# Patient Record
Sex: Female | Born: 1960 | Race: White | Hispanic: No | Marital: Married | Smoking: Never smoker
Health system: Southern US, Community
[De-identification: ages and names within clinical notes are randomized; demographics above are authoritative.]

## PROBLEM LIST (undated history)

## (undated) DIAGNOSIS — R51 Headache: Secondary | ICD-10-CM

## (undated) DIAGNOSIS — N926 Irregular menstruation, unspecified: Secondary | ICD-10-CM

## (undated) DIAGNOSIS — F988 Other specified behavioral and emotional disorders with onset usually occurring in childhood and adolescence: Secondary | ICD-10-CM

## (undated) DIAGNOSIS — R0600 Dyspnea, unspecified: Secondary | ICD-10-CM

## (undated) DIAGNOSIS — R519 Headache, unspecified: Secondary | ICD-10-CM

## (undated) DIAGNOSIS — D649 Anemia, unspecified: Secondary | ICD-10-CM

## (undated) HISTORY — PX: WISDOM TOOTH EXTRACTION: SHX21

## (undated) HISTORY — DX: Irregular menstruation, unspecified: N92.6

## (undated) HISTORY — DX: Other specified behavioral and emotional disorders with onset usually occurring in childhood and adolescence: F98.8

---

## 1997-11-21 ENCOUNTER — Inpatient Hospital Stay (HOSPITAL_COMMUNITY): Admission: AD | Admit: 1997-11-21 | Discharge: 1997-11-23 | Payer: Self-pay | Admitting: Gynecology

## 1998-01-12 ENCOUNTER — Other Ambulatory Visit: Admission: RE | Admit: 1998-01-12 | Discharge: 1998-01-12 | Payer: Self-pay | Admitting: Gynecology

## 1999-10-05 ENCOUNTER — Other Ambulatory Visit: Admission: RE | Admit: 1999-10-05 | Discharge: 1999-10-05 | Payer: Self-pay | Admitting: Gynecology

## 2000-11-19 ENCOUNTER — Other Ambulatory Visit: Admission: RE | Admit: 2000-11-19 | Discharge: 2000-11-19 | Payer: Self-pay | Admitting: Gynecology

## 2002-07-17 ENCOUNTER — Ambulatory Visit (HOSPITAL_COMMUNITY): Admission: RE | Admit: 2002-07-17 | Discharge: 2002-07-17 | Payer: Self-pay | Admitting: Internal Medicine

## 2003-03-04 ENCOUNTER — Encounter: Payer: Self-pay | Admitting: Obstetrics & Gynecology

## 2003-03-04 ENCOUNTER — Ambulatory Visit (HOSPITAL_COMMUNITY): Admission: RE | Admit: 2003-03-04 | Discharge: 2003-03-04 | Payer: Self-pay | Admitting: Obstetrics & Gynecology

## 2004-02-18 ENCOUNTER — Ambulatory Visit (HOSPITAL_COMMUNITY): Admission: RE | Admit: 2004-02-18 | Discharge: 2004-02-18 | Payer: Self-pay | Admitting: Interventional Radiology

## 2004-06-10 ENCOUNTER — Ambulatory Visit (HOSPITAL_COMMUNITY): Admission: RE | Admit: 2004-06-10 | Discharge: 2004-06-10 | Payer: Self-pay | Admitting: Obstetrics & Gynecology

## 2005-07-05 ENCOUNTER — Ambulatory Visit (HOSPITAL_COMMUNITY): Admission: RE | Admit: 2005-07-05 | Discharge: 2005-07-05 | Payer: Self-pay | Admitting: Obstetrics & Gynecology

## 2006-07-09 ENCOUNTER — Ambulatory Visit (HOSPITAL_COMMUNITY): Admission: RE | Admit: 2006-07-09 | Discharge: 2006-07-09 | Payer: Self-pay | Admitting: Obstetrics and Gynecology

## 2007-07-12 ENCOUNTER — Ambulatory Visit (HOSPITAL_COMMUNITY): Admission: RE | Admit: 2007-07-12 | Discharge: 2007-07-12 | Payer: Self-pay | Admitting: Obstetrics and Gynecology

## 2007-09-30 ENCOUNTER — Other Ambulatory Visit: Admission: RE | Admit: 2007-09-30 | Discharge: 2007-09-30 | Payer: Self-pay | Admitting: Obstetrics and Gynecology

## 2008-07-22 ENCOUNTER — Ambulatory Visit (HOSPITAL_COMMUNITY): Admission: RE | Admit: 2008-07-22 | Discharge: 2008-07-22 | Payer: Self-pay | Admitting: Obstetrics & Gynecology

## 2009-03-22 ENCOUNTER — Other Ambulatory Visit: Admission: RE | Admit: 2009-03-22 | Discharge: 2009-03-22 | Payer: Self-pay | Admitting: Obstetrics and Gynecology

## 2009-08-04 ENCOUNTER — Ambulatory Visit (HOSPITAL_COMMUNITY): Admission: RE | Admit: 2009-08-04 | Discharge: 2009-08-04 | Payer: Self-pay | Admitting: Obstetrics & Gynecology

## 2010-04-21 ENCOUNTER — Other Ambulatory Visit: Admission: RE | Admit: 2010-04-21 | Discharge: 2010-04-21 | Payer: Self-pay | Admitting: Obstetrics and Gynecology

## 2010-08-16 ENCOUNTER — Ambulatory Visit (HOSPITAL_COMMUNITY)
Admission: RE | Admit: 2010-08-16 | Discharge: 2010-08-16 | Payer: Self-pay | Source: Home / Self Care | Attending: Obstetrics & Gynecology | Admitting: Obstetrics & Gynecology

## 2010-08-25 ENCOUNTER — Encounter
Admission: RE | Admit: 2010-08-25 | Discharge: 2010-08-25 | Payer: Self-pay | Source: Home / Self Care | Attending: Obstetrics & Gynecology | Admitting: Obstetrics & Gynecology

## 2011-05-01 ENCOUNTER — Other Ambulatory Visit: Payer: Self-pay | Admitting: Adult Health

## 2011-05-01 ENCOUNTER — Other Ambulatory Visit (HOSPITAL_COMMUNITY)
Admission: RE | Admit: 2011-05-01 | Discharge: 2011-05-01 | Disposition: A | Discharge status: Home / Self Care | Payer: BC Managed Care – PPO | Source: Ambulatory Visit | Attending: Obstetrics and Gynecology | Admitting: Obstetrics and Gynecology

## 2011-05-01 DIAGNOSIS — Z01419 Encounter for gynecological examination (general) (routine) without abnormal findings: Secondary | ICD-10-CM | POA: Insufficient documentation

## 2011-08-10 ENCOUNTER — Other Ambulatory Visit: Payer: Self-pay | Admitting: Adult Health

## 2011-08-10 DIAGNOSIS — Z139 Encounter for screening, unspecified: Secondary | ICD-10-CM

## 2011-08-21 ENCOUNTER — Ambulatory Visit (HOSPITAL_COMMUNITY)
Admission: RE | Admit: 2011-08-21 | Discharge: 2011-08-21 | Disposition: A | Discharge status: Home / Self Care | Payer: BC Managed Care – PPO | Source: Ambulatory Visit | Attending: Adult Health | Admitting: Adult Health

## 2011-08-21 DIAGNOSIS — Z1231 Encounter for screening mammogram for malignant neoplasm of breast: Secondary | ICD-10-CM | POA: Insufficient documentation

## 2011-08-21 DIAGNOSIS — Z139 Encounter for screening, unspecified: Secondary | ICD-10-CM

## 2012-06-04 ENCOUNTER — Other Ambulatory Visit (HOSPITAL_COMMUNITY)
Admission: RE | Admit: 2012-06-04 | Discharge: 2012-06-04 | Disposition: A | Discharge status: Home / Self Care | Payer: BC Managed Care – PPO | Source: Ambulatory Visit | Attending: Obstetrics and Gynecology | Admitting: Obstetrics and Gynecology

## 2012-06-04 ENCOUNTER — Other Ambulatory Visit: Payer: Self-pay | Admitting: Adult Health

## 2012-06-04 DIAGNOSIS — Z01419 Encounter for gynecological examination (general) (routine) without abnormal findings: Secondary | ICD-10-CM | POA: Insufficient documentation

## 2012-06-04 DIAGNOSIS — Z1151 Encounter for screening for human papillomavirus (HPV): Secondary | ICD-10-CM | POA: Insufficient documentation

## 2012-09-04 ENCOUNTER — Other Ambulatory Visit: Payer: Self-pay | Admitting: Adult Health

## 2012-09-04 DIAGNOSIS — Z139 Encounter for screening, unspecified: Secondary | ICD-10-CM

## 2012-09-09 ENCOUNTER — Ambulatory Visit (HOSPITAL_COMMUNITY)
Admission: RE | Admit: 2012-09-09 | Discharge: 2012-09-09 | Disposition: A | Discharge status: Home / Self Care | Payer: BC Managed Care – PPO | Source: Ambulatory Visit | Attending: Adult Health | Admitting: Adult Health

## 2012-09-09 DIAGNOSIS — Z139 Encounter for screening, unspecified: Secondary | ICD-10-CM

## 2012-09-09 DIAGNOSIS — Z1231 Encounter for screening mammogram for malignant neoplasm of breast: Secondary | ICD-10-CM | POA: Insufficient documentation

## 2012-09-15 IMAGING — MG MM DIGITAL SCREENING
4 series · 4 of 4 positions shown · non-contrast
Comparison: none

DG SCREEN MAMMOGRAM BILATERAL
Bilateral CC and MLO view(s) were taken.

DIGITAL SCREENING MAMMOGRAM WITH CAD:
The breast tissue is heterogeneously dense.  Possible distortion is noted in the left breast.  Spot
compression views and possibly sonography are recommended for further evaluation.  In the right 
breast, no masses or malignant type calcifications are identified.  Compared with prior studies.
Images were processed with CAD.

[L CC]
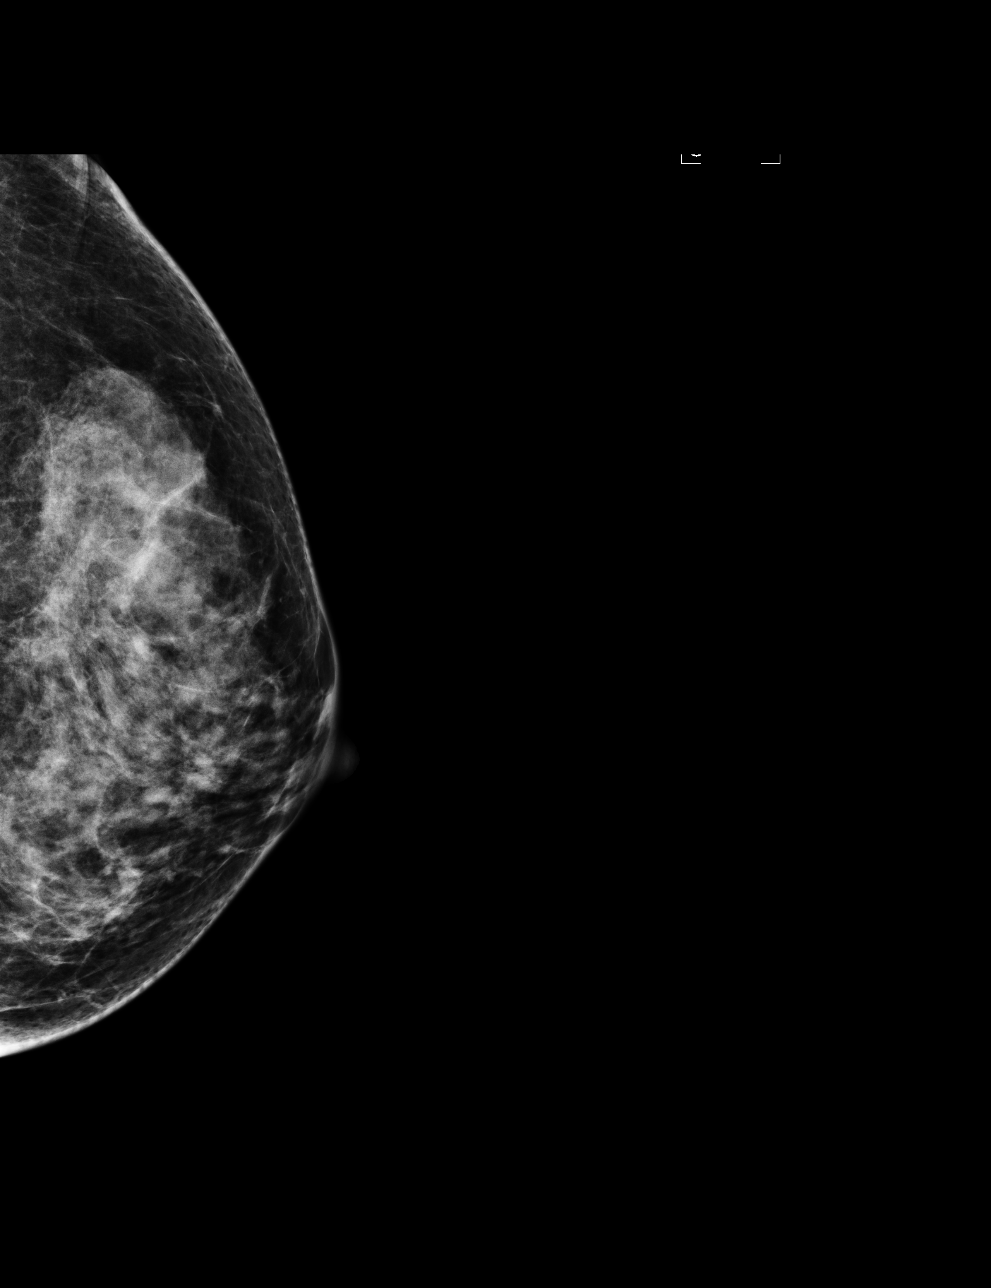

[L MLO]
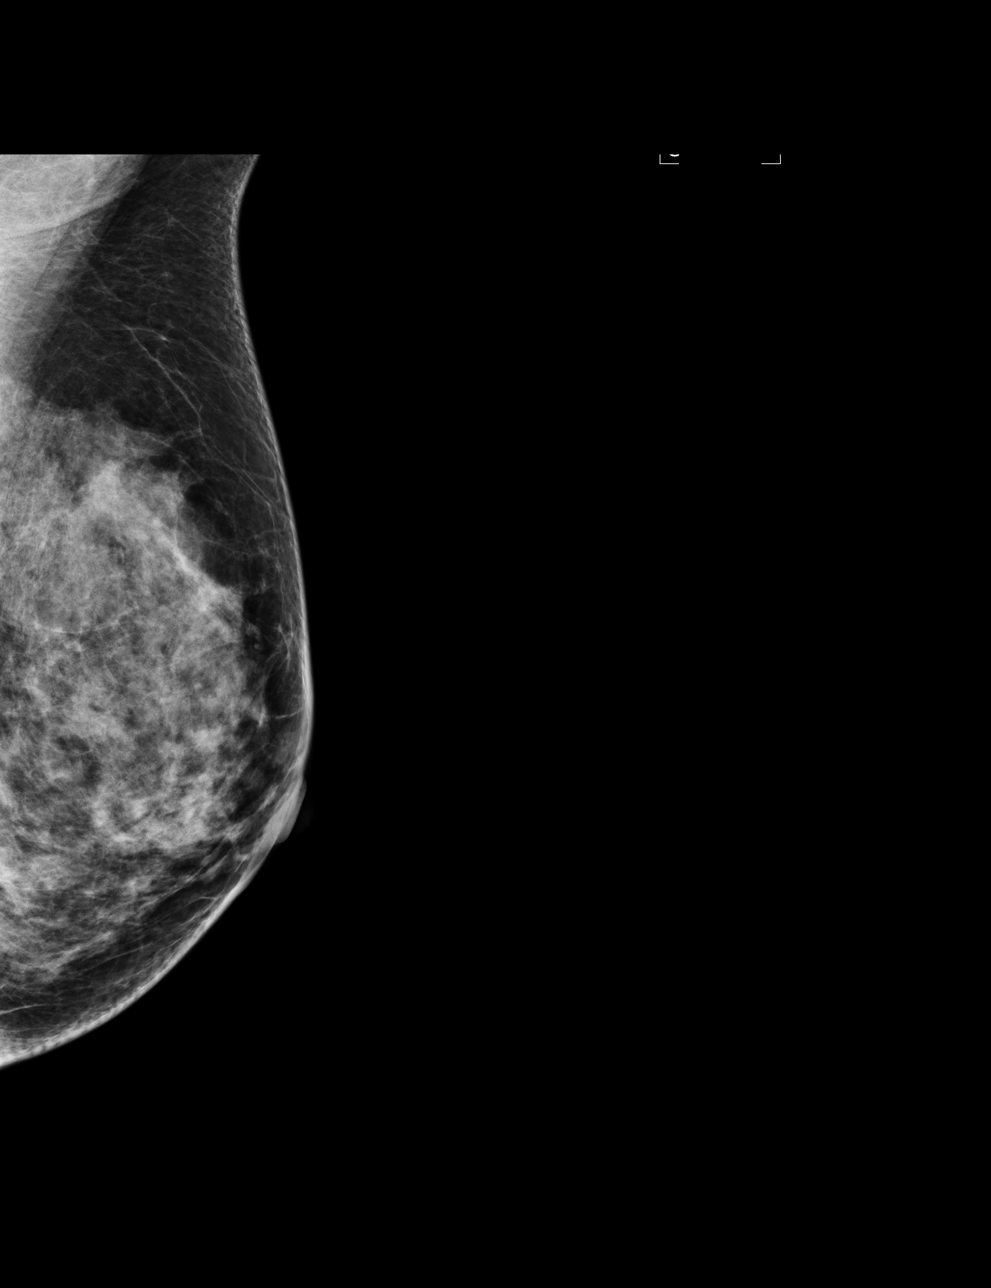

[R CC]
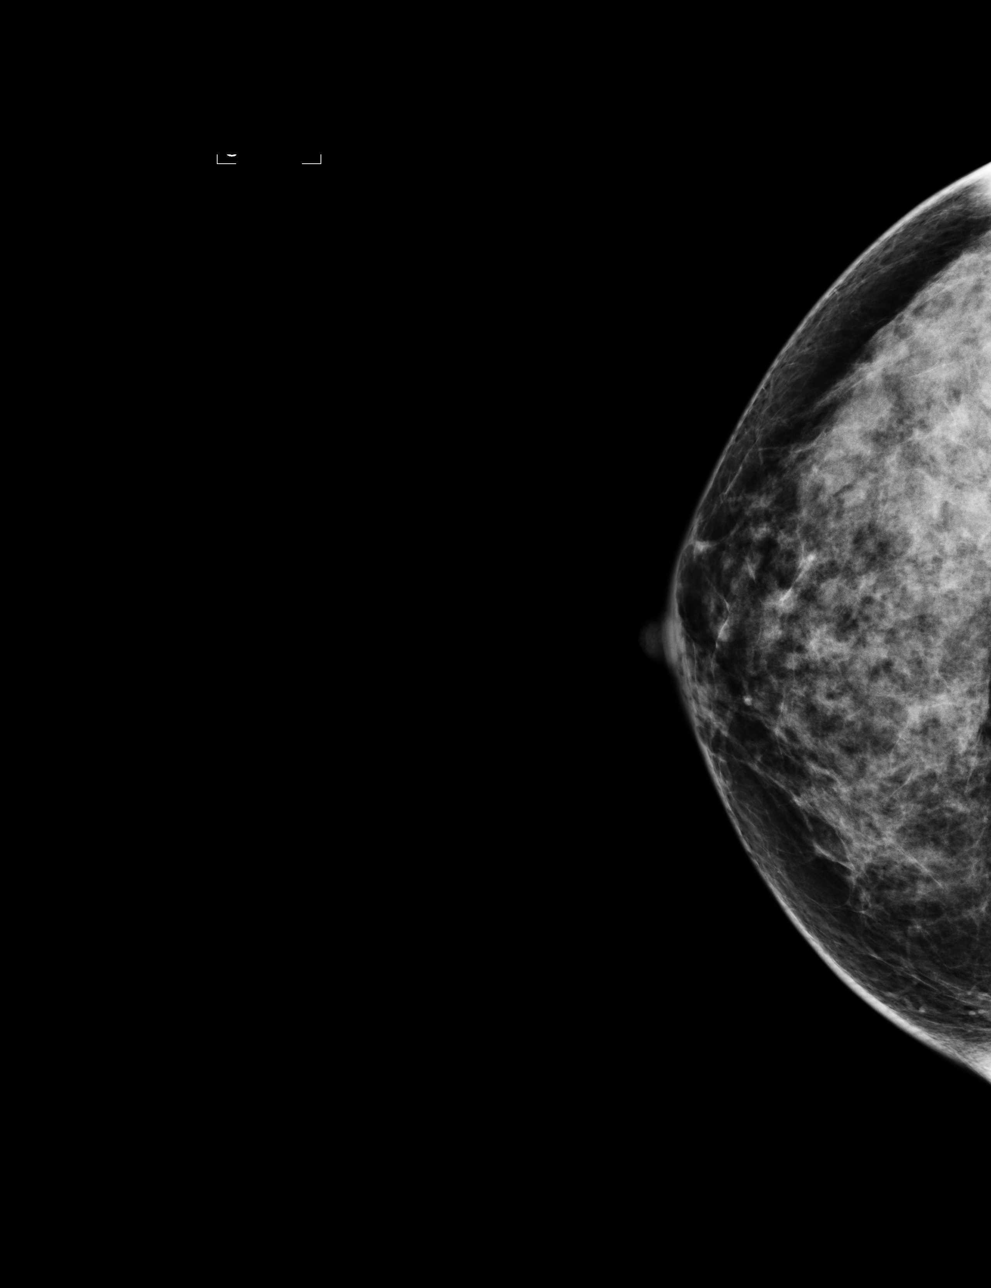

[R MLO]
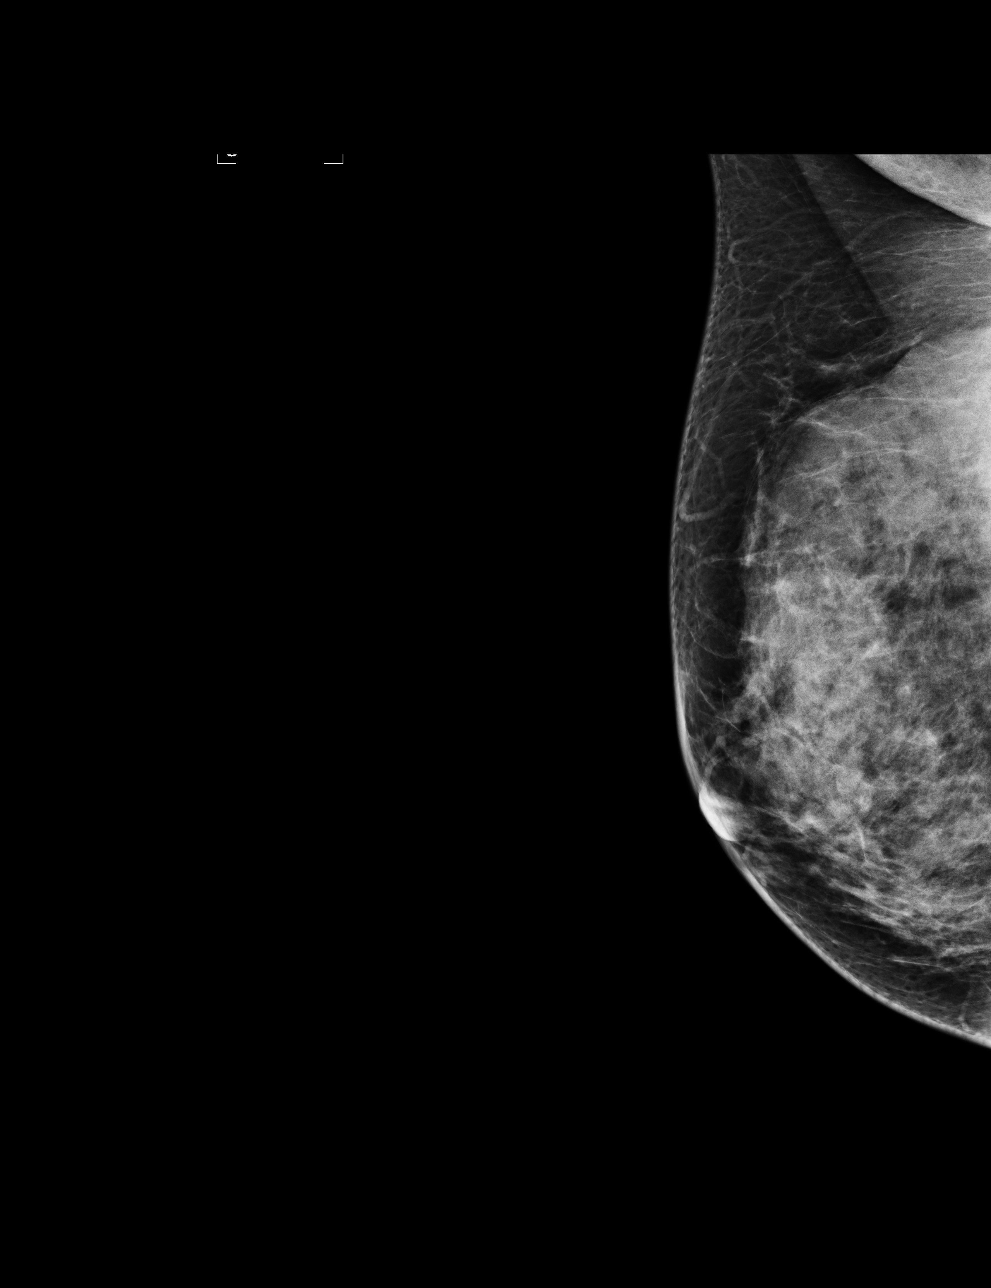

[4 of 4 positions shown; findings below may reference images not displayed]

IMPRESSION: Possible distortion, left breast.  Additional evaluation is indicated.  The patient will be 
contacted for additional studies and a supplementary report will follow.  No specific mammographic 
evidence of malignancy, right breast.

ASSESSMENT: Need additional imaging evaluation and/or prior mammograms for comparison - BI-RADS 0

Further imaging of the left breast.
,

## 2013-07-21 ENCOUNTER — Other Ambulatory Visit: Payer: Self-pay | Admitting: Adult Health

## 2013-08-12 ENCOUNTER — Encounter: Payer: Self-pay | Admitting: Adult Health

## 2013-08-12 ENCOUNTER — Ambulatory Visit (INDEPENDENT_AMBULATORY_CARE_PROVIDER_SITE_OTHER): Payer: BC Managed Care – PPO | Admitting: Adult Health

## 2013-08-12 VITALS — BP 104/60 | HR 78 | Ht 64.0 in | Wt 132.0 lb

## 2013-08-12 DIAGNOSIS — Z1212 Encounter for screening for malignant neoplasm of rectum: Secondary | ICD-10-CM

## 2013-08-12 DIAGNOSIS — Z01419 Encounter for gynecological examination (general) (routine) without abnormal findings: Secondary | ICD-10-CM

## 2013-08-12 LAB — HEMOCCULT GUIAC POC 1CARD (OFFICE): Fecal Occult Blood, POC: NEGATIVE

## 2013-08-12 NOTE — Patient Instructions (Addendum)
Physical in 1 year,pap 2016 Mammogram yearly Colonoscopy advised Menopause Menopause is the normal time of life when menstrual periods stop completely. Menopause is complete when you have missed 12 consecutive menstrual periods. It usually occurs between the ages of 48 years and 55 years. Very rarely does a woman develop menopause before the age of 40 years. At menopause, your ovaries stop producing the female hormones estrogen and progesterone. This can cause undesirable symptoms and also affect your health. Sometimes the symptoms may occur 4 5 years before the menopause begins. There is no relationship between menopause and:  Oral contraceptives.  Number of children you had.  Race.  The age your menstrual periods started (menarche). Heavy smokers and very thin women may develop menopause earlier in life. CAUSES  The ovaries stop producing the female hormones estrogen and progesterone.  Other causes include:  Surgery to remove both ovaries.  The ovaries stop functioning for no known reason.  Tumors of the pituitary gland in the brain.  Medical disease that affects the ovaries and hormone production.  Radiation treatment to the abdomen or pelvis.  Chemotherapy that affects the ovaries. SYMPTOMS   Hot flashes.  Night sweats.  Decrease in sex drive.  Vaginal dryness and thinning of the vagina causing painful intercourse.  Dryness of the skin and developing wrinkles.  Headaches.  Tiredness.  Irritability.  Memory problems.  Weight gain.  Bladder infections.  Hair growth of the face and chest.  Infertility. More serious symptoms include:  Loss of bone (osteoporosis) causing breaks (fractures).  Depression.  Hardening and narrowing of the arteries (atherosclerosis) causing heart attacks and strokes. DIAGNOSIS   When the menstrual periods have stopped for 12 straight months.  Physical exam.  Hormone studies of the blood. TREATMENT  There are many  treatment choices and nearly as many questions about them. The decisions to treat or not to treat menopausal changes is an individual choice made with your health care provider. Your health care provider can discuss the treatments with you. Together, you can decide which treatment will work best for you. Your treatment choices may include:   Hormone therapy (estrogen and progesterone).  Non-hormonal medicines.  Treating the individual symptoms with medicine (for example antidepressants for depression).  Herbal medicines that may help specific symptoms.  Counseling by a psychiatrist or psychologist.  Group therapy.  Lifestyle changes including:  Eating healthy.  Regular exercise.  Limiting caffeine and alcohol.  Stress management and meditation.  No treatment. HOME CARE INSTRUCTIONS   Take the medicine your health care provider gives you as directed.  Get plenty of sleep and rest.  Exercise regularly.  Eat a diet that contains calcium (good for the bones) and soy products (acts like estrogen hormone).  Avoid alcoholic beverages.  Do not smoke.  If you have hot flashes, dress in layers.  Take supplements, calcium, and vitamin D to strengthen bones.  You can use over-the-counter lubricants or moisturizers for vaginal dryness.  Group therapy is sometimes very helpful.  Acupuncture may be helpful in some cases. SEEK MEDICAL CARE IF:   You are not sure you are in menopause.  You are having menopausal symptoms and need advice and treatment.  You are still having menstrual periods after age 85 years.  You have pain with intercourse.  Menopause is complete (no menstrual period for 12 months) and you develop vaginal bleeding.  You need a referral to a specialist (gynecologist, psychiatrist, or psychologist) for treatment. SEEK IMMEDIATE MEDICAL CARE IF:   You  have severe depression.  You have excessive vaginal bleeding.  You fell and think you have a broken  bone.  You have pain when you urinate.  You develop leg or chest pain.  You have a fast pounding heart beat (palpitations).  You have severe headaches.  You develop vision problems.  You feel a lump in your breast.  You have abdominal pain or severe indigestion. Document Released: 10/21/2003 Document Revised: 04/02/2013 Document Reviewed: 02/27/2013 [facility] Patient Information 2014 [place], [place].

## 2013-08-12 NOTE — Progress Notes (Signed)
Patient ID: CAROLYN SYLVIA, female   DOB: 12-Nov-1960, 52 y.o.   MRN: [id number] History of Present Illness: Ondrea is a 52 year old white female, married in for a physical.She had a normal pap with negative HPV 06/04/12.   Current Medications, Allergies, Past Medical History, Past Surgical History, Family History and Social History were reviewed in [employer] record.     Review of Systems: Patient denies any headaches, blurred vision, shortness of breath, chest pain, abdominal pain, problems with bowel movements, urination, or intercourse. No joint pain or mood swings.Her periods are still monthly and last about 7 days, may be heavy at times.    Physical Exam:BP 104/60  Pulse 78  Ht 5\' 4"  (1.626 m)  Wt 132 lb (59.875 kg)  BMI 22.65 kg/m2  LMP 07/16/2013 General:  Well developed, well nourished, no acute distress Skin:  Warm and dry Neck:  Midline trachea, normal thyroid Lungs; Clear to auscultation bilaterally Breast:  No dominant palpable mass, retraction, or nipple discharge Cardiovascular: Regular rate and rhythm Abdomen:  Soft, non tender, no hepatosplenomegaly Pelvic:  External genitalia is normal in appearance.  The vagina is normal in appearance.  The cervix is bulbous.  Uterus is felt to be normal size, shape, and contour.  No  adnexal masses or tenderness noted. Rectal: Good sphincter tone, no polyps, or hemorrhoids felt.  Hemoccult negative. Extremities:  No swelling or varicosities noted Psych:  No mood changes,alert and cooperative,seems happy   Impression: Yearly gyn exam no pap    Plan: Physical in 1 year Mammogram yearly Colonoscopy advised Labs at work Discussed menopause, review handout on menopause

## 2013-09-16 ENCOUNTER — Other Ambulatory Visit: Payer: Self-pay | Admitting: Adult Health

## 2013-09-16 DIAGNOSIS — Z139 Encounter for screening, unspecified: Secondary | ICD-10-CM

## 2013-09-29 ENCOUNTER — Ambulatory Visit (HOSPITAL_COMMUNITY)
Admission: RE | Admit: 2013-09-29 | Discharge: 2013-09-29 | Disposition: A | Discharge status: Home / Self Care | Payer: BC Managed Care – PPO | Source: Ambulatory Visit | Attending: Adult Health | Admitting: Adult Health

## 2013-09-29 DIAGNOSIS — Z139 Encounter for screening, unspecified: Secondary | ICD-10-CM

## 2013-09-29 DIAGNOSIS — Z1231 Encounter for screening mammogram for malignant neoplasm of breast: Secondary | ICD-10-CM | POA: Insufficient documentation

## 2014-06-15 ENCOUNTER — Encounter: Payer: Self-pay | Admitting: Adult Health

## 2014-08-13 ENCOUNTER — Ambulatory Visit (INDEPENDENT_AMBULATORY_CARE_PROVIDER_SITE_OTHER): Payer: BC Managed Care – PPO | Admitting: Adult Health

## 2014-08-13 ENCOUNTER — Encounter: Payer: Self-pay | Admitting: Adult Health

## 2014-08-13 VITALS — BP 100/64 | HR 74 | Ht 64.0 in | Wt 133.0 lb

## 2014-08-13 DIAGNOSIS — Z1212 Encounter for screening for malignant neoplasm of rectum: Secondary | ICD-10-CM

## 2014-08-13 DIAGNOSIS — Z01419 Encounter for gynecological examination (general) (routine) without abnormal findings: Secondary | ICD-10-CM

## 2014-08-13 LAB — HEMOCCULT GUIAC POC 1CARD (OFFICE): Fecal Occult Blood, POC: NEGATIVE

## 2014-08-13 NOTE — Progress Notes (Signed)
Patient ID: Lori Roth, female   DOB: 1960-12-25, 53 y.o.   MRN: [id number] History of Present Illness:  Lori Roth is a 53 year old white female, married in for gyn exam.She had a normal pap with negative HPV 06/04/12.  Current Medications, Allergies, Past Medical History, Past Surgical History, Family History and Social History were reviewed in [employer] record.     Review of Systems: Patient denies any headaches, blurred vision, shortness of breath, chest pain, abdominal pain, problems with bowel movements, urination, or intercourse.  No joint pain or mood swings, still having regular periods, no hot flashes, some sleep issues at times.   Physical Exam:BP 100/64 mmHg  Pulse 74  Ht 5\' 4"  (1.626 m)  Wt 133 lb (60.328 kg)  BMI 22.82 kg/m2  LMP 07/28/2014 General:  Well developed, well nourished, no acute distress Skin:  Warm and dry Neck:  Midline trachea, normal thyroid Lungs; Clear to auscultation bilaterally Breast:  No dominant palpable mass, retraction, or nipple discharge Cardiovascular: Regular rate and rhythm Abdomen:  Soft, non tender, no hepatosplenomegaly Pelvic:  External genitalia is normal in appearance.  The vagina is normal in appearance. The cervix is bulbous.  Uterus is felt to be normal size, shape, and contour.  No  adnexal masses or tenderness noted. Rectal: Good sphincter tone, no polyps, or hemorrhoids felt.  Hemoccult negative. Extremities:  No swelling or varicosities noted Psych:  No mood changes,alert and cooperative,seems happy  Impression:  Well woman gyn exam no pap   Plan: Pap and physical in 1 year Mammogram yearly  Labs per PCP Colonoscopy advised

## 2014-08-13 NOTE — Patient Instructions (Addendum)
Mammogram yearly  Pap and physical in 1 year Colonoscopy advised  Labs with PCP Perimenopause Perimenopause is the time when your body begins to move into the menopause (no menstrual period for 12 straight months). It is a natural process. Perimenopause can begin 2-8 years before the menopause and usually lasts for 1 year after the menopause. During this time, your ovaries may or may not produce an egg. The ovaries vary in their production of estrogen and progesterone hormones each month. This can cause irregular menstrual periods, difficulty getting pregnant, vaginal bleeding between periods, and uncomfortable symptoms. CAUSES  Irregular production of the ovarian hormones, estrogen and progesterone, and not ovulating every month.  Other causes include:  Tumor of the pituitary gland in the brain.  Medical disease that affects the ovaries.  Radiation treatment.  Chemotherapy.  Unknown causes.  Heavy smoking and excessive alcohol intake can bring on perimenopause sooner. SIGNS AND SYMPTOMS   Hot flashes.  Night sweats.  Irregular menstrual periods.  Decreased sex drive.  Vaginal dryness.  Headaches.  Mood swings.  Depression.  Memory problems.  Irritability.  Tiredness.  Weight gain.  Trouble getting pregnant.  The beginning of losing bone cells (osteoporosis).  The beginning of hardening of the arteries (atherosclerosis). DIAGNOSIS  Your health care provider will make a diagnosis by analyzing your age, menstrual history, and symptoms. He or she will do a physical exam and note any changes in your body, especially your female organs. Female hormone tests may or may not be helpful depending on the amount of female hormones you produce and when you produce them. However, other hormone tests may be helpful to rule out other problems. TREATMENT  In some cases, no treatment is needed. The decision on whether treatment is necessary during the perimenopause should be  made by you and your health care provider based on how the symptoms are affecting you and your lifestyle. Various treatments are available, such as:  Treating individual symptoms with a specific medicine for that symptom.  Herbal medicines that can help specific symptoms.  Counseling.  Group therapy. HOME CARE INSTRUCTIONS   Keep track of your menstrual periods (when they occur, how heavy they are, how long between periods, and how long they last) as well as your symptoms and when they started.  Only take over-the-counter or prescription medicines as directed by your health care provider.  Sleep and rest.  Exercise.  Eat a diet that contains calcium (good for your bones) and soy (acts like the estrogen hormone).  Do not smoke.  Avoid alcoholic beverages.  Take vitamin supplements as recommended by your health care provider. Taking vitamin E may help in certain cases.  Take calcium and vitamin D supplements to help prevent bone loss.  Group therapy is sometimes helpful.  Acupuncture may help in some cases. SEEK MEDICAL CARE IF:   You have questions about any symptoms you are having.  You need a referral to a specialist (gynecologist, psychiatrist, or psychologist). SEEK IMMEDIATE MEDICAL CARE IF:   You have vaginal bleeding.  Your period lasts longer than 8 days.  Your periods are recurring sooner than 21 days.  You have bleeding after intercourse.  You have severe depression.  You have pain when you urinate.  You have severe headaches.  You have vision problems. Document Released: 09/07/2004 Document Revised: 05/21/2013 Document Reviewed: 02/27/2013 [facility] Patient Information 2015 [place], [place]. This information is not intended to replace advice given to you by your health care provider. Make sure you  discuss any questions you have with your health care provider. Menopause Menopause is the normal time of life when menstrual periods stop completely.  Menopause is complete when you have missed 12 consecutive menstrual periods. It usually occurs between the ages of 65 years and 51 years. Very rarely does a woman develop menopause before the age of 79 years. At menopause, your ovaries stop producing the female hormones estrogen and progesterone. This can cause undesirable symptoms and also affect your health. Sometimes the symptoms may occur 4-5 years before the menopause begins. There is no relationship between menopause and:  Oral contraceptives.  Number of children you had.  Race.  The age your menstrual periods started (menarche). Heavy smokers and very thin women may develop menopause earlier in life. CAUSES  The ovaries stop producing the female hormones estrogen and progesterone.  Other causes include:  Surgery to remove both ovaries.  The ovaries stop functioning for no known reason.  Tumors of the pituitary gland in the brain.  Medical disease that affects the ovaries and hormone production.  Radiation treatment to the abdomen or pelvis.  Chemotherapy that affects the ovaries. SYMPTOMS   Hot flashes.  Night sweats.  Decrease in sex drive.  Vaginal dryness and thinning of the vagina causing painful intercourse.  Dryness of the skin and developing wrinkles.  Headaches.  Tiredness.  Irritability.  Memory problems.  Weight gain.  Bladder infections.  Hair growth of the face and chest.  Infertility. More serious symptoms include:  Loss of bone (osteoporosis) causing breaks (fractures).  Depression.  Hardening and narrowing of the arteries (atherosclerosis) causing heart attacks and strokes. DIAGNOSIS   When the menstrual periods have stopped for 12 straight months.  Physical exam.  Hormone studies of the blood. TREATMENT  There are many treatment choices and nearly as many questions about them. The decisions to treat or not to treat menopausal changes is an individual choice made with your  health care provider. Your health care provider can discuss the treatments with you. Together, you can decide which treatment will work best for you. Your treatment choices may include:   Hormone therapy (estrogen and progesterone).  Non-hormonal medicines.  Treating the individual symptoms with medicine (for example antidepressants for depression).  Herbal medicines that may help specific symptoms.  Counseling by a psychiatrist or psychologist.  Group therapy.  Lifestyle changes including:  Eating healthy.  Regular exercise.  Limiting caffeine and alcohol.  Stress management and meditation.  No treatment. HOME CARE INSTRUCTIONS   Take the medicine your health care provider gives you as directed.  Get plenty of sleep and rest.  Exercise regularly.  Eat a diet that contains calcium (good for the bones) and soy products (acts like estrogen hormone).  Avoid alcoholic beverages.  Do not smoke.  If you have hot flashes, dress in layers.  Take supplements, calcium, and vitamin D to strengthen bones.  You can use over-the-counter lubricants or moisturizers for vaginal dryness.  Group therapy is sometimes very helpful.  Acupuncture may be helpful in some cases. SEEK MEDICAL CARE IF:   You are not sure you are in menopause.  You are having menopausal symptoms and need advice and treatment.  You are still having menstrual periods after age 33 years.  You have pain with intercourse.  Menopause is complete (no menstrual period for 12 months) and you develop vaginal bleeding.  You need a referral to a specialist (gynecologist, psychiatrist, or psychologist) for treatment. SEEK IMMEDIATE MEDICAL CARE IF:  You have severe depression.  You have excessive vaginal bleeding.  You fell and think you have a broken bone.  You have pain when you urinate.  You develop leg or chest pain.  You have a fast pounding heart beat (palpitations).  You have severe  headaches.  You develop vision problems.  You feel a lump in your breast.  You have abdominal pain or severe indigestion. Document Released: 10/21/2003 Document Revised: 04/02/2013 Document Reviewed: 02/27/2013 [facility] Patient Information 2015 [place], [place]. This information is not intended to replace advice given to you by your health care provider. Make sure you discuss any questions you have with your health care provider.

## 2014-09-18 ENCOUNTER — Other Ambulatory Visit: Payer: Self-pay | Admitting: Adult Health

## 2014-09-18 DIAGNOSIS — Z1231 Encounter for screening mammogram for malignant neoplasm of breast: Secondary | ICD-10-CM

## 2014-10-05 ENCOUNTER — Ambulatory Visit (HOSPITAL_COMMUNITY)
Admission: RE | Admit: 2014-10-05 | Discharge: 2014-10-05 | Disposition: A | Discharge status: Home / Self Care | Payer: BC Managed Care – PPO | Source: Ambulatory Visit | Attending: Adult Health | Admitting: Adult Health

## 2014-10-05 DIAGNOSIS — Z1231 Encounter for screening mammogram for malignant neoplasm of breast: Secondary | ICD-10-CM | POA: Diagnosis not present

## 2015-08-30 ENCOUNTER — Encounter: Payer: Self-pay | Admitting: Adult Health

## 2015-08-30 ENCOUNTER — Other Ambulatory Visit (HOSPITAL_COMMUNITY)
Admission: RE | Admit: 2015-08-30 | Discharge: 2015-08-30 | Disposition: A | Discharge status: Home / Self Care | Payer: BC Managed Care – PPO | Source: Ambulatory Visit | Attending: Adult Health | Admitting: Adult Health

## 2015-08-30 ENCOUNTER — Ambulatory Visit (INDEPENDENT_AMBULATORY_CARE_PROVIDER_SITE_OTHER): Payer: BC Managed Care – PPO | Admitting: Adult Health

## 2015-08-30 VITALS — BP 130/62 | HR 92 | Ht 64.0 in | Wt 135.0 lb

## 2015-08-30 DIAGNOSIS — Z1151 Encounter for screening for human papillomavirus (HPV): Secondary | ICD-10-CM | POA: Insufficient documentation

## 2015-08-30 DIAGNOSIS — N926 Irregular menstruation, unspecified: Secondary | ICD-10-CM

## 2015-08-30 DIAGNOSIS — Z01419 Encounter for gynecological examination (general) (routine) without abnormal findings: Secondary | ICD-10-CM | POA: Diagnosis present

## 2015-08-30 DIAGNOSIS — Z1211 Encounter for screening for malignant neoplasm of colon: Secondary | ICD-10-CM

## 2015-08-30 DIAGNOSIS — Z01411 Encounter for gynecological examination (general) (routine) with abnormal findings: Secondary | ICD-10-CM | POA: Diagnosis not present

## 2015-08-30 HISTORY — DX: Irregular menstruation, unspecified: N92.6

## 2015-08-30 LAB — HEMOCCULT GUIAC POC 1CARD (OFFICE): Fecal Occult Blood, POC: NEGATIVE

## 2015-08-30 NOTE — Progress Notes (Signed)
Patient ID: Lori Roth, female   DOB: [birth year], 55 y.o.   MRN: [id number] History of Present Illness: Lori Roth is a 55 year old white female, married in for well woman gyn exam and pap, she is still having periods, the last one was irregular with continued spotting.She is still teaching.  PCP is Dr Willey Blade.  Current Medications, Allergies, Past Medical History, Past Surgical History, Family History and Social History were reviewed in [employer] record.     Review of Systems: Patient denies any headaches, hearing loss, fatigue, blurred vision, shortness of breath, chest pain, abdominal pain, problems with bowel movements, urination, or intercourse. No joint pain or mood swings.See HPI for positives.    Physical Exam:BP 130/62 mmHg  Pulse 92  Ht 5\' 4"  (1.626 m)  Wt 135 lb (61.236 kg)  BMI 23.16 kg/m2  LMP 08/01/2015 General:  Well developed, well nourished, no acute distress Skin:  Warm and dry Neck:  Midline trachea, normal thyroid, good ROM, no lymphadenopathy Lungs; Clear to auscultation bilaterally Breast:  No dominant palpable mass, retraction, or nipple discharge Cardiovascular: Regular rate and rhythm Abdomen:  Soft, non tender, no hepatosplenomegaly Pelvic:  External genitalia is normal in appearance, no lesions.  The vagina is normal in appearance. Urethra has no lesions or masses. The cervix is bulbous. Pap with HPV performed. Uterus is felt to be normal size, shape, and contour.  No adnexal masses or tenderness noted.Bladder is non tender, no masses felt. Rectal: Good sphincter tone, no polyps, or hemorrhoids felt.  Hemoccult negative. Extremities/musculoskeletal:  No swelling or varicosities noted, no clubbing or cyanosis Psych:  No mood changes, alert and cooperative,seems happy Discussed to keep period log, she is menopausal.  Impression: Well woman gyn exam and pap Irregular bleeding    Plan: Physical in 1 year, pap in 3 if  normal Mammogram yearly Colonoscopy advised Labs at work

## 2015-08-30 NOTE — Patient Instructions (Signed)
Physical in 1 year, pap in 3 years if normal Mammogram yearly Colonoscopy advised Labs at work

## 2015-09-01 LAB — CYTOLOGY - PAP

## 2015-09-03 ENCOUNTER — Other Ambulatory Visit: Payer: Self-pay | Admitting: Adult Health

## 2015-09-03 DIAGNOSIS — Z1231 Encounter for screening mammogram for malignant neoplasm of breast: Secondary | ICD-10-CM

## 2015-10-11 ENCOUNTER — Ambulatory Visit (HOSPITAL_COMMUNITY)
Admission: RE | Admit: 2015-10-11 | Discharge: 2015-10-11 | Disposition: A | Discharge status: Home / Self Care | Payer: BC Managed Care – PPO | Source: Ambulatory Visit | Attending: Adult Health | Admitting: Adult Health

## 2015-10-11 DIAGNOSIS — Z1231 Encounter for screening mammogram for malignant neoplasm of breast: Secondary | ICD-10-CM | POA: Insufficient documentation

## 2016-05-17 ENCOUNTER — Ambulatory Visit (INDEPENDENT_AMBULATORY_CARE_PROVIDER_SITE_OTHER): Payer: BC Managed Care – PPO | Admitting: Adult Health

## 2016-05-17 ENCOUNTER — Encounter: Payer: Self-pay | Admitting: Adult Health

## 2016-05-17 VITALS — BP 130/62 | HR 98 | Ht 64.0 in | Wt 133.5 lb

## 2016-05-17 DIAGNOSIS — N6310 Unspecified lump in the right breast, unspecified quadrant: Secondary | ICD-10-CM | POA: Diagnosis not present

## 2016-05-17 NOTE — Progress Notes (Signed)
Subjective:     Patient ID: Lori Roth, female   DOB: 1961-07-13, 55 y.o.   MRN: [id number]  HPI Lori Roth is a 55 year old white female, married, in complaining of right breast mass with tenderness.Has just finished period.   Review of Systems +breast mass that is tender Reviewed past medical,surgical, social and family history. Reviewed medications and allergies.     Objective:   Physical Exam BP 130/62 (BP Location: Left Arm, Patient Position: Sitting, Cuff Size: Normal)   Pulse 98   Ht 5\' 4"  (1.626 m)   Wt 133 lb 8 oz (60.6 kg)   LMP 05/06/2016 (Exact Date)   BMI 22.92 kg/m   Skin warm and dry,  Breasts:no dominate palpable mass, retraction or nipple discharge on left, on right no retraction or nipple discharge, has tender, mobile 3 cm oval mass at 10 o'clock, will get diagnostic right mammogram and [place] at [facility],discussed could be cyst or fibroadenoma. PHQ 9 score 7, she denies being depressed but is worried about breast mass.  Face time 15 minutes, with 50% cousneling.  Assessment:     Right breast mass    Plan:     Get diagnostic right mammogram and [place] 10/10 at 3:10 pm at [facility]  Follow up prn

## 2016-05-17 NOTE — Patient Instructions (Signed)
Get mammogram and [place] 10/10 at 3:10 pm at [facility]

## 2016-05-19 ENCOUNTER — Other Ambulatory Visit: Payer: Self-pay | Admitting: Adult Health

## 2016-05-19 DIAGNOSIS — N6310 Unspecified lump in the right breast, unspecified quadrant: Secondary | ICD-10-CM

## 2016-05-23 ENCOUNTER — Encounter (HOSPITAL_COMMUNITY): Payer: BC Managed Care – PPO

## 2016-05-23 ENCOUNTER — Ambulatory Visit (HOSPITAL_COMMUNITY)
Admission: RE | Admit: 2016-05-23 | Discharge: 2016-05-23 | Disposition: A | Discharge status: Home / Self Care | Payer: BC Managed Care – PPO | Source: Ambulatory Visit | Attending: Adult Health | Admitting: Adult Health

## 2016-05-23 DIAGNOSIS — N6310 Unspecified lump in the right breast, unspecified quadrant: Secondary | ICD-10-CM

## 2016-05-23 DIAGNOSIS — N6001 Solitary cyst of right breast: Secondary | ICD-10-CM | POA: Diagnosis not present

## 2016-10-19 ENCOUNTER — Telehealth: Payer: Self-pay | Admitting: *Deleted

## 2016-10-19 NOTE — Telephone Encounter (Signed)
Patient states she left work today feeling weak and looking pale. 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 She is not feeling dizzy at the moment since laying down. She has been bleeding heavily for 2 weeks. Advised patient to come in tomorrow for evaluation and hgb check. Patient verbalized understanding.

## 2016-10-20 ENCOUNTER — Ambulatory Visit (INDEPENDENT_AMBULATORY_CARE_PROVIDER_SITE_OTHER): Payer: BC Managed Care – PPO | Admitting: Obstetrics and Gynecology

## 2016-10-20 ENCOUNTER — Encounter: Payer: Self-pay | Admitting: Obstetrics and Gynecology

## 2016-10-20 ENCOUNTER — Other Ambulatory Visit: Payer: Self-pay | Admitting: Obstetrics and Gynecology

## 2016-10-20 ENCOUNTER — Ambulatory Visit (HOSPITAL_COMMUNITY)
Admission: RE | Admit: 2016-10-20 | Discharge: 2016-10-20 | Disposition: A | Discharge status: Home / Self Care | Payer: BC Managed Care – PPO | Source: Ambulatory Visit | Attending: Obstetrics and Gynecology | Admitting: Obstetrics and Gynecology

## 2016-10-20 VITALS — BP 118/70 | HR 80 | Ht 64.0 in | Wt 134.8 lb

## 2016-10-20 DIAGNOSIS — D500 Iron deficiency anemia secondary to blood loss (chronic): Secondary | ICD-10-CM

## 2016-10-20 DIAGNOSIS — N939 Abnormal uterine and vaginal bleeding, unspecified: Secondary | ICD-10-CM

## 2016-10-20 DIAGNOSIS — D649 Anemia, unspecified: Secondary | ICD-10-CM

## 2016-10-20 DIAGNOSIS — D259 Leiomyoma of uterus, unspecified: Secondary | ICD-10-CM | POA: Insufficient documentation

## 2016-10-20 DIAGNOSIS — N920 Excessive and frequent menstruation with regular cycle: Secondary | ICD-10-CM

## 2016-10-20 LAB — POCT HEMOGLOBIN: HEMOGLOBIN: 7.4 g/dL — AB (ref 12.2–16.2)

## 2016-10-20 MED ORDER — MEGESTROL ACETATE 40 MG PO TABS: 40.0000 mg | ORAL_TABLET | Freq: Three times a day (TID) | ORAL | 2 refills | Status: DC

## 2016-10-20 MED ORDER — FERRALET 90 90-1 MG PO TABS: 1.0000 | ORAL_TABLET | Freq: Every day | ORAL | 99 refills | Status: DC

## 2016-10-20 NOTE — Progress Notes (Signed)
   [facility] Visit  10/20/2016       Patient name: Lori Roth MRN [id number]  Date of birth: Sep 06, 1960  CC & HPI:  Lori Roth is a 56 y.o. female presenting today for heavy vaginal bleeding x 2 weeks. Pt states she did not have a  period in January 2018 but was regular up until that point. Her periods usually last ~7 days with 2 days of heavy bleeding at the start. She states she has noticed  large clumps with her currently bleeding which is also irregular for her. Her Hgb this AM was 7.4. Pt has no other physical complaints at this time. No alleviating factors noted.    ROS:  ROS Otherwise negative for acute change except as noted in the HPI.  Pertinent History Reviewed:   Reviewed: Significant for irregular bleeding  Medical         Past Medical History:  Diagnosis Date  . ADD (attention deficit disorder)   . Irregular bleeding 08/30/2015                              Surgical Hx:    Past Surgical History:  Procedure Laterality Date  . WISDOM TOOTH EXTRACTION     Medications: Reviewed & Updated - see associated section                       Current Outpatient Prescriptions:  Marland Kitchen  Melatonin 1 MG TABS, Take by mouth., Disp: , Rfl:  .  Multiple Vitamin (MULTIVITAMIN) tablet, Take 1 tablet by mouth daily., Disp: , Rfl:    Social History: Reviewed -  reports that she has never smoked. She has never used smokeless tobacco.  Objective Findings:  Vitals: Blood pressure 118/70, pulse 80, height 5\' 4"  (1.626 m), weight 134 lb 12.8 oz (61.1 kg), last menstrual period 10/05/2016.  Physical Examination: General appearance - alert, well appearing, and in no distress Mental status - alert, oriented to person, place, and time Pelvic -  VULVA: normal appearing vulva with no masses, tenderness or lesions,  VAGINA: normal appearing vagina with normal color and discharge, no lesions,  CERVIX: Internal os is open UTERUS: uterus is anterior; minimally enlarged   Assessment  & Plan:   A:  1. Menorrhagia  2. Anemia   P:  1. Will start on Megase 40  tid 2. Order [place] at [facility] this pm. 3 Fe++ bid  By signing my name below, I, Evelene Croon, attest that this documentation has been prepared under the direction and in the presence of Jonnie Kind, MD . Electronically Signed: Evelene Croon, Scribe. 10/20/2016. 9:36 AM. I personally performed the services described in this documentation, which was SCRIBED in my presence. The recorded information has been reviewed and considered accurate. It has been edited as necessary during review. Jonnie Kind, MD

## 2016-10-25 ENCOUNTER — Ambulatory Visit (INDEPENDENT_AMBULATORY_CARE_PROVIDER_SITE_OTHER): Payer: BC Managed Care – PPO | Admitting: Obstetrics and Gynecology

## 2016-10-25 ENCOUNTER — Other Ambulatory Visit: Payer: BC Managed Care – PPO

## 2016-10-25 ENCOUNTER — Encounter: Payer: Self-pay | Admitting: Obstetrics and Gynecology

## 2016-10-25 VITALS — BP 132/74 | HR 96 | Wt 138.0 lb

## 2016-10-25 DIAGNOSIS — D649 Anemia, unspecified: Secondary | ICD-10-CM | POA: Diagnosis not present

## 2016-10-25 LAB — POCT HEMOGLOBIN: HEMOGLOBIN: 8 g/dL — AB (ref 12.2–16.2)

## 2016-10-25 NOTE — Progress Notes (Signed)
   [facility] Visit  10/25/16           Patient name: Lori Roth MRN [id number]  Date of birth: 02-26-1961  CC & HPI:  Lori Roth is a 56 y.o. female presenting today for follow up from [place]. She reports associated vaginal bleeding. Pt has tried megace and iron supplements with relief of her symptoms. Denies any other symptoms. She states that her last menstrual cycle was 07/2016.   ROS:  ROS +vaginal bleeding  Pertinent History Reviewed:   Reviewed: Significant for irregular bleeding Medical         Past Medical History:  Diagnosis Date  . ADD (attention deficit disorder)   . Irregular bleeding 08/30/2015                              Surgical Hx:    Past Surgical History:  Procedure Laterality Date  . WISDOM TOOTH EXTRACTION     Medications: Reviewed & Updated - see associated section                       Current Outpatient Prescriptions:  .  Fe Cbn-Fe Gluc-FA-B12-C-DSS (FERRALET 90) 90-1 MG TABS, Take 1 tablet by mouth at bedtime., Disp: 30 each, Rfl: prn .  megestrol (MEGACE) 40 MG tablet, Take 1 tablet (40 mg total) by mouth 3 (three) times daily., Disp: 45 tablet, Rfl: 2 .  Melatonin 1 MG TABS, Take by mouth., Disp: , Rfl:  .  Multiple Vitamin (MULTIVITAMIN) tablet, Take 1 tablet by mouth daily., Disp: , Rfl:    Social History: Reviewed -  reports that she has never smoked. She has never used smokeless tobacco.  Objective Findings:  Vitals: Blood pressure 132/74, pulse 96, weight 138 lb (62.6 kg), last menstrual period 10/05/2016.  Physical Examination: discussion only   Discussion: 1. Discussed with pt risks and benefits of operative hysteroscopy with D&C  At end of discussion, pt had opportunity to ask questions and has no further questions at this time.   Specific discussion of operative hysteroscopy with D&C as noted above. Greater than 50% was spent in counseling and coordination of care with the patient.   Total time greater than: 25  minutes.    Assessment & Plan:   A: I personally performed the services described in this documentation, which was SCRIBED in my presence. The recorded information has been reviewed and considered accurate. It has been edited as necessary during review. Jonnie Kind, MD   1. Submucosal uterine fibroid  P:  1. Continue megace Rx and iron supplements  2. operative hysteroscopy with D&C with resection of submucosal fibroid    By signing my name below, I, Soijett Blue, attest that this documentation has been prepared under the direction and in the presence of Jonnie Kind, MD. Electronically Signed: [place], ED Scribe. 10/25/16. 2:32 PM.  I personally performed the services described in this documentation, which was SCRIBED in my presence. The recorded information has been reviewed and considered accurate. It has been edited as necessary during review. Jonnie Kind, MD

## 2016-10-30 NOTE — Patient Instructions (Signed)
Hysteroscopy  Hysteroscopy is a procedure used for looking inside the womb (uterus). It may be done for various reasons, including:  · To evaluate abnormal bleeding, fibroid (benign, noncancerous) tumors, polyps, scar tissue (adhesions), and possibly cancer of the uterus.  · To look for lumps (tumors) and other uterine growths.  · To look for causes of why a woman cannot get pregnant (infertility), causes of recurrent loss of pregnancy (miscarriages), or a lost intrauterine device (IUD).  · To perform a sterilization by blocking the fallopian tubes from inside the uterus.    In this procedure, a thin, flexible tube with a tiny light and camera on the end of it (hysteroscope) is used to look inside the uterus. A hysteroscopy should be done right after a menstrual period to be sure you are not pregnant.  LET YOUR HEALTH CARE PROVIDER KNOW ABOUT:  · Any allergies you have.  · All medicines you are taking, including vitamins, herbs, eye drops, creams, and over-the-counter medicines.  · Previous problems you or members of your family have had with the use of anesthetics.  · Any blood disorders you have.  · Previous surgeries you have had.  · Medical conditions you have.  RISKS AND COMPLICATIONS  Generally, this is a safe procedure. However, as with any procedure, complications can occur. Possible complications include:  · Putting a hole in the uterus.  · Excessive bleeding.  · Infection.  · Damage to the cervix.  · Injury to other organs.  · Allergic reaction to medicines.  · Too much fluid used in the uterus for the procedure.    BEFORE THE PROCEDURE  · Ask your health care provider about changing or stopping any regular medicines.  · Do not take aspirin or blood thinners for 1 week before the procedure, or as directed by your health care provider. These can cause bleeding.  · If you smoke, do not smoke for 2 weeks before the procedure.  · In some cases, a medicine is placed in the cervix the day before the procedure.  This medicine makes the cervix have a larger opening (dilate). This makes it easier for the instrument to be inserted into the uterus during the procedure.  · Do not eat or drink anything for at least 8 hours before the surgery.  · Arrange for someone to take you home after the procedure.  PROCEDURE  · You may be given a medicine to relax you (sedative). You may also be given one of the following:  ? A medicine that numbs the area around the cervix (local anesthetic).  ? A medicine that makes you sleep through the procedure (general anesthetic).  · The hysteroscope is inserted through the vagina into the uterus. The camera on the hysteroscope sends a picture to a TV screen. This gives the surgeon a good view inside the uterus.  · During the procedure, air or a liquid is put into the uterus, which allows the surgeon to see better.  · Sometimes, tissue is gently scraped from inside the uterus. These tissue samples are sent to a lab for testing.  What to expect after the procedure  · If you had a general anesthetic, you may be groggy for a couple hours after the procedure.  · If you had a local anesthetic, you will be able to go home as soon as you are stable and feel ready.  · You may have some cramping. This normally lasts for a couple days.  · You may   have bleeding, which varies from light spotting for a few days to menstrual-like bleeding for 3-7 days. This is normal.  · If your test results are not back during the visit, make an appointment with your health care provider to find out the results.  This information is not intended to replace advice given to you by your health care provider. Make sure you discuss any questions you have with your health care provider.  Document Released: 11/06/2000 Document Revised: 01/06/2016 Document Reviewed: 02/27/2013  Elsevier Interactive Patient Education © 2017 [employer]vier [employer]

## 2016-11-02 ENCOUNTER — Other Ambulatory Visit: Payer: Self-pay | Admitting: Obstetrics and Gynecology

## 2016-11-02 ENCOUNTER — Telehealth: Payer: Self-pay | Admitting: Obstetrics and Gynecology

## 2016-11-02 NOTE — Telephone Encounter (Signed)
Left message with pt's husband that [facility]

## 2016-11-03 ENCOUNTER — Encounter (HOSPITAL_COMMUNITY): Payer: Self-pay | Admitting: *Deleted

## 2016-11-06 ENCOUNTER — Inpatient Hospital Stay (HOSPITAL_COMMUNITY): Admission: RE | Admit: 2016-11-06 | Payer: BC Managed Care – PPO | Source: Ambulatory Visit

## 2016-11-06 ENCOUNTER — Encounter (HOSPITAL_COMMUNITY)
Admission: RE | Admit: 2016-11-06 | Discharge: 2016-11-06 | Disposition: A | Discharge status: Home / Self Care | Payer: BC Managed Care – PPO | Source: Ambulatory Visit | Attending: Obstetrics and Gynecology | Admitting: Obstetrics and Gynecology

## 2016-11-06 ENCOUNTER — Encounter (HOSPITAL_COMMUNITY): Payer: Self-pay

## 2016-11-06 DIAGNOSIS — D649 Anemia, unspecified: Secondary | ICD-10-CM | POA: Diagnosis not present

## 2016-11-06 DIAGNOSIS — D250 Submucous leiomyoma of uterus: Secondary | ICD-10-CM | POA: Diagnosis not present

## 2016-11-06 DIAGNOSIS — N920 Excessive and frequent menstruation with regular cycle: Secondary | ICD-10-CM | POA: Diagnosis present

## 2016-11-06 DIAGNOSIS — F909 Attention-deficit hyperactivity disorder, unspecified type: Secondary | ICD-10-CM | POA: Diagnosis not present

## 2016-11-06 DIAGNOSIS — R51 Headache: Secondary | ICD-10-CM | POA: Diagnosis not present

## 2016-11-06 DIAGNOSIS — D251 Intramural leiomyoma of uterus: Secondary | ICD-10-CM | POA: Diagnosis not present

## 2016-11-06 HISTORY — DX: Dyspnea, unspecified: R06.00

## 2016-11-06 HISTORY — DX: Headache: R51

## 2016-11-06 HISTORY — DX: Headache, unspecified: R51.9

## 2016-11-06 LAB — CBC
HCT: 31.9 % — ABNORMAL LOW (ref 36.0–46.0)
Hemoglobin: 9.8 g/dL — ABNORMAL LOW (ref 12.0–15.0)
MCH: 23.8 pg — AB (ref 26.0–34.0)
MCHC: 30.7 g/dL (ref 30.0–36.0)
MCV: 77.6 fL — AB (ref 78.0–100.0)
PLATELETS: 357 10*3/uL (ref 150–400)
RBC: 4.11 MIL/uL (ref 3.87–5.11)
RDW: 21.9 % — AB (ref 11.5–15.5)
WBC: 5.8 10*3/uL (ref 4.0–10.5)

## 2016-11-06 LAB — COMPREHENSIVE METABOLIC PANEL
ALT: 12 U/L — ABNORMAL LOW (ref 14–54)
ANION GAP: 7 (ref 5–15)
AST: 15 U/L (ref 15–41)
Albumin: 4.1 g/dL (ref 3.5–5.0)
Alkaline Phosphatase: 45 U/L (ref 38–126)
BUN: 20 mg/dL (ref 6–20)
CHLORIDE: 110 mmol/L (ref 101–111)
CO2: 23 mmol/L (ref 22–32)
Calcium: 9.2 mg/dL (ref 8.9–10.3)
Creatinine, Ser: 0.67 mg/dL (ref 0.44–1.00)
GFR calc Af Amer: >60 mL/min (ref >60)
Glucose, Bld: 88 mg/dL (ref 65–99)
POTASSIUM: 3.5 mmol/L (ref 3.5–5.1)
Sodium: 140 mmol/L (ref 135–145)
Total Bilirubin: 0.4 mg/dL (ref 0.3–1.2)
Total Protein: 7.8 g/dL (ref 6.5–8.1)

## 2016-11-06 NOTE — Patient Instructions (Signed)
Your procedure is scheduled on:  Tomorrow, November 07, 2016  Enter through the [employer] [facility] at:  9:30 AM  Pick up the phone at the desk and dial [phone].  Call this number if you have problems the morning of surgery: [phone].  Remember: Do NOT eat food or drink after:  Midnight tonight  Take these medicines the morning of surgery with a SIP OF WATER:  None  Stop ALL herbal medications at this time  Do NOT smoke the day of surgery.  Do NOT wear jewelry (body piercing), metal hair clips/bobby pins, make-up, or nail polish. Do NOT wear lotions, powders, or perfumes.  You may wear deodorant. Do NOT shave for 48 hours prior to surgery. Do NOT bring valuables to the hospital. Contacts, dentures, or bridgework may not be worn into surgery.  Have a responsible adult drive you home and stay with you for 24 hours after your procedure  Bring a copy of your healthcare power of attorney and living will documents.

## 2016-11-07 ENCOUNTER — Ambulatory Visit (HOSPITAL_COMMUNITY): Payer: BC Managed Care – PPO | Admitting: Anesthesiology

## 2016-11-07 ENCOUNTER — Ambulatory Visit (HOSPITAL_COMMUNITY)
Admission: RE | Admit: 2016-11-07 | Discharge: 2016-11-07 | Disposition: A | Discharge status: Home / Self Care | Payer: BC Managed Care – PPO | Source: Ambulatory Visit | Attending: Obstetrics and Gynecology | Admitting: Obstetrics and Gynecology

## 2016-11-07 ENCOUNTER — Encounter (HOSPITAL_COMMUNITY): Payer: Self-pay | Admitting: Anesthesiology

## 2016-11-07 ENCOUNTER — Encounter (HOSPITAL_COMMUNITY)
Admission: RE | Disposition: A | Discharge status: Home / Self Care | Payer: Self-pay | Source: Ambulatory Visit | Attending: Obstetrics and Gynecology

## 2016-11-07 DIAGNOSIS — R51 Headache: Secondary | ICD-10-CM | POA: Insufficient documentation

## 2016-11-07 DIAGNOSIS — D250 Submucous leiomyoma of uterus: Secondary | ICD-10-CM | POA: Insufficient documentation

## 2016-11-07 DIAGNOSIS — D649 Anemia, unspecified: Secondary | ICD-10-CM | POA: Insufficient documentation

## 2016-11-07 DIAGNOSIS — N924 Excessive bleeding in the premenopausal period: Secondary | ICD-10-CM

## 2016-11-07 DIAGNOSIS — D251 Intramural leiomyoma of uterus: Secondary | ICD-10-CM | POA: Insufficient documentation

## 2016-11-07 DIAGNOSIS — N920 Excessive and frequent menstruation with regular cycle: Secondary | ICD-10-CM | POA: Insufficient documentation

## 2016-11-07 DIAGNOSIS — F909 Attention-deficit hyperactivity disorder, unspecified type: Secondary | ICD-10-CM | POA: Insufficient documentation

## 2016-11-07 HISTORY — DX: Anemia, unspecified: D64.9

## 2016-11-07 HISTORY — PX: DILATATION & CURETTAGE/HYSTEROSCOPY WITH MYOSURE: SHX6511

## 2016-11-07 LAB — HCG, SERUM, QUALITATIVE: PREG SERUM: NEGATIVE

## 2016-11-07 SURGERY — DILATATION AND CURETTAGE /HYSTEROSCOPY
Anesthesia: General

## 2016-11-07 SURGERY — DILATATION & CURETTAGE/HYSTEROSCOPY WITH MYOSURE
Anesthesia: General | Site: Vagina

## 2016-11-07 MED ORDER — DEXAMETHASONE SODIUM PHOSPHATE 4 MG/ML IJ SOLN
INTRAMUSCULAR | Status: AC
  Filled 2016-11-07: qty 1

## 2016-11-07 MED ORDER — MIDAZOLAM HCL 2 MG/2ML IJ SOLN
INTRAMUSCULAR | Status: AC
  Filled 2016-11-07: qty 2

## 2016-11-07 MED ORDER — MIDAZOLAM HCL 2 MG/2ML IJ SOLN
INTRAMUSCULAR | Status: DC | PRN
Start: 2016-11-07 — End: 2016-11-07
  Administered 2016-11-07: 2 mg via INTRAVENOUS

## 2016-11-07 MED ORDER — LIDOCAINE HCL (CARDIAC) 20 MG/ML IV SOLN
INTRAVENOUS | Status: AC
  Filled 2016-11-07: qty 5

## 2016-11-07 MED ORDER — BUPIVACAINE-EPINEPHRINE (PF) 0.5% -1:200000 IJ SOLN
INTRAMUSCULAR | Status: AC
  Filled 2016-11-07: qty 30

## 2016-11-07 MED ORDER — SCOPOLAMINE 1 MG/3DAYS TD PT72
MEDICATED_PATCH | TRANSDERMAL | Status: AC
  Administered 2016-11-07: 1.5 mg via TRANSDERMAL
  Filled 2016-11-07: qty 1

## 2016-11-07 MED ORDER — ACETAMINOPHEN 325 MG PO TABS: 325.0000 mg | ORAL_TABLET | ORAL | Status: DC | PRN

## 2016-11-07 MED ORDER — CEFAZOLIN SODIUM-DEXTROSE 2-4 GM/100ML-% IV SOLN
2.0000 g | INTRAVENOUS | Status: AC
  Administered 2016-11-07: 2 g via INTRAVENOUS

## 2016-11-07 MED ORDER — MEPERIDINE HCL 25 MG/ML IJ SOLN: 6.2500 mg | INTRAMUSCULAR | Status: DC | PRN

## 2016-11-07 MED ORDER — HYDROCODONE-ACETAMINOPHEN 5-325 MG PO TABS: 1.0000 | ORAL_TABLET | Freq: Four times a day (QID) | ORAL | 0 refills | Status: DC | PRN

## 2016-11-07 MED ORDER — ACETAMINOPHEN 160 MG/5ML PO SOLN: 325.0000 mg | ORAL | Status: DC | PRN

## 2016-11-07 MED ORDER — BUPIVACAINE HCL 0.5 % IJ SOLN
INTRAMUSCULAR | Status: DC | PRN
  Administered 2016-11-07: 6 mL

## 2016-11-07 MED ORDER — PROPOFOL 10 MG/ML IV BOLUS
INTRAVENOUS | Status: DC | PRN
  Administered 2016-11-07: 150 mg via INTRAVENOUS

## 2016-11-07 MED ORDER — FENTANYL CITRATE (PF) 100 MCG/2ML IJ SOLN
INTRAMUSCULAR | Status: AC
  Filled 2016-11-07: qty 2

## 2016-11-07 MED ORDER — PROPOFOL 10 MG/ML IV BOLUS
INTRAVENOUS | Status: AC
  Filled 2016-11-07: qty 20

## 2016-11-07 MED ORDER — OXYCODONE HCL 5 MG/5ML PO SOLN: 5.0000 mg | Freq: Once | ORAL | Status: DC | PRN

## 2016-11-07 MED ORDER — KETOROLAC TROMETHAMINE 30 MG/ML IJ SOLN
INTRAMUSCULAR | Status: AC
  Filled 2016-11-07: qty 1

## 2016-11-07 MED ORDER — BUPIVACAINE HCL (PF) 0.5 % IJ SOLN
INTRAMUSCULAR | Status: AC
  Filled 2016-11-07: qty 30

## 2016-11-07 MED ORDER — KETOROLAC TROMETHAMINE 30 MG/ML IJ SOLN
INTRAMUSCULAR | Status: DC | PRN
  Administered 2016-11-07: 30 mg via INTRAVENOUS

## 2016-11-07 MED ORDER — ONDANSETRON HCL 4 MG/2ML IJ SOLN: 4.0000 mg | Freq: Once | INTRAMUSCULAR | Status: DC | PRN

## 2016-11-07 MED ORDER — FENTANYL CITRATE (PF) 100 MCG/2ML IJ SOLN: 25.0000 ug | INTRAMUSCULAR | Status: DC | PRN

## 2016-11-07 MED ORDER — OXYCODONE HCL 5 MG PO TABS: 5.0000 mg | ORAL_TABLET | Freq: Once | ORAL | Status: DC | PRN

## 2016-11-07 MED ORDER — LIDOCAINE HCL (CARDIAC) 20 MG/ML IV SOLN
INTRAVENOUS | Status: DC | PRN
  Administered 2016-11-07: 60 mg via INTRAVENOUS

## 2016-11-07 MED ORDER — ONDANSETRON HCL 4 MG/2ML IJ SOLN
INTRAMUSCULAR | Status: DC | PRN
Start: 2016-11-07 — End: 2016-11-07
  Administered 2016-11-07: 4 mg via INTRAVENOUS

## 2016-11-07 MED ORDER — SODIUM CHLORIDE 0.9 % IR SOLN
Status: DC | PRN
  Administered 2016-11-07: 3000 mL

## 2016-11-07 MED ORDER — FENTANYL CITRATE (PF) 100 MCG/2ML IJ SOLN
INTRAMUSCULAR | Status: DC | PRN
  Administered 2016-11-07: 100 ug via INTRAVENOUS

## 2016-11-07 MED ORDER — SCOPOLAMINE 1 MG/3DAYS TD PT72
1.0000 | MEDICATED_PATCH | Freq: Once | TRANSDERMAL | Status: DC
  Administered 2016-11-07: 1.5 mg via TRANSDERMAL

## 2016-11-07 MED ORDER — LACTATED RINGERS IV SOLN
INTRAVENOUS | Status: DC
  Administered 2016-11-07: 125 mL/h via INTRAVENOUS

## 2016-11-07 MED ORDER — ONDANSETRON HCL 4 MG/2ML IJ SOLN
INTRAMUSCULAR | Status: AC
  Filled 2016-11-07: qty 2

## 2016-11-07 MED ORDER — KETOROLAC TROMETHAMINE 30 MG/ML IJ SOLN: 30.0000 mg | Freq: Once | INTRAMUSCULAR | Status: DC

## 2016-11-07 MED ORDER — DEXAMETHASONE SODIUM PHOSPHATE 10 MG/ML IJ SOLN
INTRAMUSCULAR | Status: DC | PRN
  Administered 2016-11-07: 4 mg via INTRAVENOUS

## 2016-11-07 SURGICAL SUPPLY — 19 items
CANISTER SUCT 3000ML PPV (MISCELLANEOUS) ×3 IMPLANT
CATH ROBINSON RED A/P 16FR (CATHETERS) ×3 IMPLANT
CLOTH BEACON ORANGE TIMEOUT ST (SAFETY) ×3 IMPLANT
CONTAINER PREFILL 10% NBF 60ML (FORM) ×6 IMPLANT
DECANTER SPIKE VIAL GLASS SM (MISCELLANEOUS) ×3 IMPLANT
DEVICE MYOSURE LITE (MISCELLANEOUS) IMPLANT
DEVICE MYOSURE REACH (MISCELLANEOUS) IMPLANT
FILTER ARTHROSCOPY CONVERTOR (FILTER) ×3 IMPLANT
GLOVE BIOGEL PI IND STRL 7.0 (GLOVE) ×1 IMPLANT
GLOVE BIOGEL PI INDICATOR 7.0 (GLOVE) ×2
GOWN STRL REUS W/TWL 2XL LVL3 (GOWN DISPOSABLE) ×3 IMPLANT
GOWN STRL REUS W/TWL LRG LVL3 (GOWN DISPOSABLE) ×3 IMPLANT
MYOSURE XL FIBROID REM (MISCELLANEOUS) ×3
PACK VAGINAL MINOR WOMEN LF (CUSTOM PROCEDURE TRAY) ×3 IMPLANT
PAD OB MATERNITY 4.3X12.25 (PERSONAL CARE ITEMS) ×3 IMPLANT
SEAL ROD LENS SCOPE MYOSURE (ABLATOR) ×3 IMPLANT
SYSTEM TISS REMOVAL MYSR XL RM (MISCELLANEOUS) IMPLANT
TUBING AQUILEX INFLOW (TUBING) ×3 IMPLANT
TUBING AQUILEX OUTFLOW (TUBING) ×3 IMPLANT

## 2016-11-07 NOTE — Anesthesia Postprocedure Evaluation (Addendum)
Anesthesia Post Note  Patient: Lori Roth  Procedure(s) Performed: Procedure(s) (LRB): DILATATION & CURETTAGE/HYSTEROSCOPY (N/A)  Patient location during evaluation: PACU Anesthesia Type: General Level of consciousness: awake Pain management: pain level controlled Vital Signs Assessment: post-procedure vital signs reviewed and stable Respiratory status: spontaneous breathing Cardiovascular status: stable Postop Assessment: no signs of nausea or vomiting Anesthetic complications: no        Last Vitals:  Vitals:   11/07/16 1245 11/07/16 1300  BP: 112/66 105/74  Pulse: 71 71  Resp: 16 16  Temp:  36.7 C    Last Pain:  Vitals:   11/07/16 0938  TempSrc: Oral   Pain Goal: Patients Stated Pain Goal: 0 (11/07/16 1230)               Dehlia Kilner JR,JOHN Sydney Hasten

## 2016-11-07 NOTE — Op Note (Signed)
  Please see the brief operative for surgical details

## 2016-11-07 NOTE — Discharge Instructions (Signed)
Hysteroscopy Hysteroscopy is a procedure used for looking inside the womb (uterus). It may be done for various reasons, including:  To evaluate abnormal bleeding, fibroid (benign, noncancerous) tumors, polyps, scar tissue (adhesions), and possibly cancer of the uterus.  To look for lumps (tumors) and other uterine growths.  To look for causes of why a woman cannot get pregnant (infertility), causes of recurrent loss of pregnancy (miscarriages), or a lost intrauterine device (IUD).  To perform a sterilization by blocking the fallopian tubes from inside the uterus. In this procedure, a thin, flexible tube with a tiny light and camera on the end of it (hysteroscope) is used to look inside the uterus. A hysteroscopy should be done right after a menstrual period to be sure you are not pregnant. LET [facility] CARE PROVIDER KNOW ABOUT:  Any allergies you have.  All medicines you are taking, including vitamins, herbs, eye drops, creams, and over-the-counter medicines.  Previous problems you or members of your family have had with the use of anesthetics.  Any blood disorders you have.  Previous surgeries you have had.  Medical conditions you have. RISKS AND COMPLICATIONS Generally, this is a safe procedure. However, as with any procedure, complications can occur. Possible complications include:  Putting a hole in the uterus.  Excessive bleeding.  Infection.  Damage to the cervix.  Injury to other organs.  Allergic reaction to medicines.  Too much fluid used in the uterus for the procedure. BEFORE THE PROCEDURE  Ask your health care provider about changing or stopping any regular medicines.  Do not take aspirin or blood thinners for 1 week before the procedure, or as directed by your health care provider. These can cause bleeding.  If you smoke, do not smoke for 2 weeks before the procedure.  In some cases, a medicine is placed in the cervix the day before the procedure. This  medicine makes the cervix have a larger opening (dilate). This makes it easier for the instrument to be inserted into the uterus during the procedure.  Do not eat or drink anything for at least 8 hours before the surgery.  Arrange for someone to take you home after the procedure. PROCEDURE  You may be given a medicine to relax you (sedative). You may also be given one of the following:  A medicine that numbs the area around the cervix (local anesthetic).  A medicine that makes you sleep through the procedure (general anesthetic).  The hysteroscope is inserted through the vagina into the uterus. The camera on the hysteroscope sends a picture to a TV screen. This gives the surgeon a good view inside the uterus.  During the procedure, air or a liquid is put into the uterus, which allows the surgeon to see better.  Sometimes, tissue is gently scraped from inside the uterus. These tissue samples are sent to a lab for testing. What to expect after the procedure  If you had a general anesthetic, you may be groggy for a couple hours after the procedure.  If you had a local anesthetic, you will be able to go home as soon as you are stable and feel ready.  You may have some cramping. This normally lasts for a couple days.  You may have bleeding, which varies from light spotting for a few days to menstrual-like bleeding for 3-7 days. This is normal.  If your test results are not back during the visit, make an appointment with your health care provider to find out the results. This  information is not intended to replace advice given to you by your health care provider. Make sure you discuss any questions you have with your health care provider. Document Released: 11/06/2000 Document Revised: 01/06/2016 Document Reviewed: 02/27/2013 Elsevier Interactive Patient Education  2017 [employer] INSTRUCTIONS: HYSTEROSCOPY / ENDOMETRIAL ABLATION The following instructions have been prepared  to help you care for yourself upon your return home.  May Remove Scop patch on or before  May take Ibuprofen after  May take stool softner while taking narcotic pain medication to prevent constipation.  Drink plenty of water.  Personal hygiene:  Use sanitary pads for vaginal drainage, not tampons.  Shower the day after your procedure.  NO tub baths, pools or Jacuzzis for 2-3 weeks.  Wipe front to back after using the bathroom.  Activity and limitations:  Do NOT drive or operate any equipment for 24 hours. The effects of anesthesia are still present and drowsiness may result.  Do NOT rest in bed all day.  Walking is encouraged.  Walk up and down stairs slowly.  You may resume your normal activity in one to two days or as indicated by your physician. Sexual activity: NO intercourse for at least 2 weeks after the procedure, or as indicated by your Doctor.  Diet: Eat a light meal as desired this evening. You may resume your usual diet tomorrow.  Return to Work: You may resume your work activities in one to two days or as indicated by Marine scientist.  What to expect after your surgery: Expect to have vaginal bleeding/discharge for 2-3 days and spotting for up to 10 days. It is not unusual to have soreness for up to 1-2 weeks. You may have a slight burning sensation when you urinate for the first day. Mild cramps may continue for a couple of days. You may have a regular period in 2-6 weeks.  Call your doctor for any of the following:  Excessive vaginal bleeding or clotting, saturating and changing one pad every hour.  Inability to urinate 6 hours after discharge from hospital.  Pain not relieved by pain medication.  Fever of 100.4 F or greater.  Unusual vaginal discharge or odor.  Return to office _________________Call for an appointment ___________________ Patients signature: ______________________ Nurses signature ________________________  Post Anesthesia Care  Unit [phone] Post Anesthesia Home Care Instructions  Activity: Get plenty of rest for the remainder of the day. A responsible individual must stay with you for 24 hours following the procedure.  For the next 24 hours, DO NOT: -Drive a car -Paediatric nurse -Drink alcoholic beverages -Take any medication unless instructed by your physician -Make any legal decisions or sign important papers.  Meals: Start with liquid foods such as gelatin or soup. Progress to regular foods as tolerated. Avoid greasy, spicy, heavy foods. If nausea and/or vomiting occur, drink only clear liquids until the nausea and/or vomiting subsides. Call your physician if vomiting continues.  Special Instructions/Symptoms: Your throat may feel dry or sore from the anesthesia or the breathing tube placed in your throat during surgery. If this causes discomfort, gargle with warm salt water. The discomfort should disappear within 24 hours.  If you had a scopolamine patch placed behind your ear for the management of post- operative nausea and/or vomiting:  1. The medication in the patch is effective for 72 hours, after which it should be removed.  Wrap patch in a tissue and discard in the trash. Wash hands thoroughly with soap and water. 2. You may remove  the patch earlier than 72 hours if you experience unpleasant side effects which may include dry mouth, dizziness or visual disturbances. 3. Avoid touching the patch. Wash your hands with soap and water after contact with the patch.

## 2016-11-07 NOTE — Transfer of Care (Signed)
Immediate Anesthesia Transfer of Care Note  Patient: Lori Roth  Procedure(s) Performed: Procedure(s) with comments: DILATATION & CURETTAGE/HYSTEROSCOPY (N/A) - X LARGE SCOPE  Patient Location: PACU  Anesthesia Type:General  Level of Consciousness: awake, alert  and oriented  Airway & Oxygen Therapy: Patient Spontanous Breathing and Patient connected to nasal cannula oxygen  Post-op Assessment: Report given to RN and Post -op Vital signs reviewed and stable  Post vital signs: Reviewed and stable  Last Vitals:  Vitals:   11/07/16 0938  BP: 118/69  Pulse: 84  Resp: 20  Temp: 36.7 C    Last Pain:  Vitals:   11/07/16 0938  TempSrc: Oral      Patients Stated Pain Goal: 4 (66/29/47 6546)  Complications: No apparent anesthesia complications

## 2016-11-07 NOTE — Anesthesia Procedure Notes (Signed)
Procedure Name: LMA Insertion Date/Time: 11/07/2016 11:29 AM Performed by: Jonna Munro Pre-anesthesia Checklist: Patient identified, Emergency Drugs available, Suction available, Patient being monitored and Timeout performed Patient Re-evaluated:Patient Re-evaluated prior to inductionOxygen Delivery Method: Circle system utilized Preoxygenation: Pre-oxygenation with 100% oxygen Intubation Type: IV induction LMA: LMA inserted LMA Size: 4.0 Number of attempts: 1 Placement Confirmation: positive ETCO2 and breath sounds checked- equal and bilateral Tube secured with: Tape Dental Injury: Teeth and Oropharynx as per pre-operative assessment

## 2016-11-07 NOTE — H&P (Signed)
Lori Roth is an 56 y.o. female. She is admitted to [facility] for Hysteroscopy, Dilation and curettage, and removal of submucous fibroid by Myosure resection, after an episode of very heavy vaginal bleeding, resulting in HGB of 7.4, now recovered to 9.8, and evaluation by TV ultrasound identifying a 2.3 x 2.2 x 2.1 cm anterior submucosal fibroid, also with a similar intramural fibroid on the right that is not considered clinically significant. The patient is scheduled for resection at [facility] this morning. The endometrium is of normal thickness for premenopausal status, and ovaries are considered normal.  Pertinent Gynecological History: Menses: flow is excessive with use of both pads or tampons on heaviest days particularly during her last bleeding episode.  Bleeding: heavy menses, without intermenstrual bleeding Contraception: abstinence DES exposure: unknown Blood transfusions: none Sexually transmitted diseases: no past history Previous GYN Procedures:   Last mammogram: normal Date: 05/23/16 Last pap: normal Date: 08/2015 OB History: G, P   Menstrual History: Menarche age:  No LMP recorded. heavy in [place] 2 weeks. Previously lasting 7 days with clots.    Past Medical History:  Diagnosis Date  . ADD (attention deficit disorder)   . Anemia   . Dyspnea   . Headache    Migraines  . Irregular bleeding 08/30/2015    Past Surgical History:  Procedure Laterality Date  . WISDOM TOOTH EXTRACTION      Family History  Problem Relation Age of Onset  . Diabetes Mother   . Hypertension Mother     Social History:  reports that she has never smoked. She has never used smokeless tobacco. She reports that she does not drink alcohol or use drugs.  Allergies: No Known Allergies  No prescriptions prior to admission.    ROS  Height 5\' 4"  (1.626 m), weight 134 lb (60.8 kg). Physical Exam  Constitutional: She appears well-developed and well-nourished.  HENT:   Head: Normocephalic and atraumatic.  Eyes: Pupils are equal, round, and reactive to light.  Neck: Normal range of motion.  Cardiovascular: Normal rate.   Respiratory: Effort normal.  GI: Soft.  Genitourinary: Vagina normal and uterus normal.  normal to slight enlargement, CLINICAL DATA:  Vaginal bleeding.  EXAM: TRANSABDOMINAL AND TRANSVAGINAL ULTRASOUND OF PELVIS  TECHNIQUE: Both transabdominal and transvaginal ultrasound examinations of the pelvis were performed. Transabdominal technique was performed for global imaging of the pelvis including uterus, ovaries, adnexal regions, and pelvic cul-de-sac. It was necessary to proceed with endovaginal exam following the transabdominal exam to visualize the endometrium and ovaries.  COMPARISON:  None  FINDINGS: Uterus  Measurements: 10.8 x 6.7 x 6.7 cm. Right lateral fundal hypoechoic uterine mass measuring 3 x 2.4 x 2.6 cm. Posterior uterine hypoechoic mass measuring 2.4 x 2.5 x 2.1 cm. Anterior submucosal 2.3 x 2.2 x 2.1 cm hypoechoic mass most consistent with a fibroid.  Endometrium  Thickness: 11 mm.  No focal abnormality visualized.  Right ovary  Not visualized.  Left ovary  Measurements: 2.6 x 2.2 x 2.9 cm. Normal appearance/no adnexal mass.  Other findings  No abnormal free fluid.  IMPRESSION: 1. Three small uterine fibroids. 2. Nonvisualized right ovary. 3. Otherwise unremarkable pelvic ultrasound.   Electronically Signed   By: Kathreen Devoid   On: 10/20/2016 19:38 Results for orders placed or performed during the hospital encounter of 11/06/16 (from the past 24 hour(s))  CBC     Status: Abnormal   Collection Time: 11/06/16  1:40 PM  Result Value Ref Range   WBC 5.8 4.0 -  10.5 K/uL   RBC 4.11 3.87 - 5.11 MIL/uL   Hemoglobin 9.8 (L) 12.0 - 15.0 g/dL   HCT 31.9 (L) 36.0 - 46.0 %   MCV 77.6 (L) 78.0 - 100.0 fL   MCH 23.8 (L) 26.0 - 34.0 pg   MCHC 30.7 30.0 - 36.0 g/dL   RDW 21.9 (H)  11.5 - 15.5 %   Platelets 357 150 - 400 K/uL  Comprehensive metabolic panel     Status: Abnormal   Collection Time: 11/06/16  1:40 PM  Result Value Ref Range   Sodium 140 135 - 145 mmol/L   Potassium 3.5 3.5 - 5.1 mmol/L   Chloride 110 101 - 111 mmol/L   CO2 23 22 - 32 mmol/L   Glucose, Bld 88 65 - 99 mg/dL   BUN 20 6 - 20 mg/dL   Creatinine, Ser 0.67 0.44 - 1.00 mg/dL   Calcium 9.2 8.9 - 10.3 mg/dL   Total Protein 7.8 6.5 - 8.1 g/dL   Albumin 4.1 3.5 - 5.0 g/dL   AST 15 15 - 41 U/L   ALT 12 (L) 14 - 54 U/L   Alkaline Phosphatase 45 38 - 126 U/L   Total Bilirubin 0.4 0.3 - 1.2 mg/dL   GFR calc non Af Amer >60 >60 mL/min   GFR calc Af Amer >60 >60 mL/min   Anion gap 7 5 - 15    No results found.  Assessment/Plan: 1. Menorrhagia, with subsequent anemia, felt due to submucosal fibroid, with plans for hysteroscopy, Dilation and curettage, with resection of submucosal fibroid . Risks of bleeding , incomplete resection, need for further surgery including possible hysterectomy reviewed with patient.  [name] 11/07/2016, 5:46 AM

## 2016-11-07 NOTE — Anesthesia Preprocedure Evaluation (Signed)
Anesthesia Evaluation  Patient identified by MRN, date of birth, ID band Patient awake    Reviewed: Allergy & Precautions, NPO status , Patient's Chart, lab work & pertinent test results  Airway Mallampati: I       Dental no notable dental hx.    Pulmonary    Pulmonary exam normal        Cardiovascular negative cardio ROS Normal cardiovascular exam Rhythm:Regular Rate:Normal     Neuro/Psych negative psych ROS   GI/Hepatic negative GI ROS, Neg liver ROS,   Endo/Other  negative endocrine ROS  Renal/GU negative Renal ROS     Musculoskeletal negative musculoskeletal ROS (+)   Abdominal Normal abdominal exam  (+)   Peds  Hematology   Anesthesia Other Findings   Reproductive/Obstetrics                             Anesthesia Physical Anesthesia Plan  ASA: II  Anesthesia Plan: General   Post-op Pain Management:    Induction: Intravenous  Airway Management Planned: LMA  Additional Equipment:   Intra-op Plan:   Post-operative Plan:   Informed Consent: I have reviewed the patients History and Physical, chart, labs and discussed the procedure including the risks, benefits and alternatives for the proposed anesthesia with the patient or authorized representative who has indicated his/her understanding and acceptance.     Plan Discussed with: CRNA and Surgeon  Anesthesia Plan Comments:         Anesthesia Quick Evaluation

## 2016-11-07 NOTE — Brief Op Note (Addendum)
11/07/2016  12:18 PM  PATIENT:  Joetta Manners  56 y.o. female  PRE-OPERATIVE DIAGNOSIS:  [place] DIAGNOSIS:  Mennorrhagia, anemia, intramural fibroid  PROCEDURE:  Procedure(s) with comments: DILATATION & CURETTAGE/HYSTEROSCOPY (N/A) - X LARGE SCOPE  SURGEON:  Surgeon(s) and Role:    * Jonnie Kind, MD - Primary  PHYSICIAN ASSISTANT:   ASSISTANTS: none   ANESTHESIA:   general  EBL:  Total I/O In: 800 [I.V.:800] Out: 155 [Urine:150; Blood:5]  BLOOD ADMINISTERED:none  DRAINS: none   LOCAL MEDICATIONS USED:  MARCAINE    and Amount: 10 ml  SPECIMEN:  Source of Specimen:  endometrial curettings  DISPOSITION OF SPECIMEN:  PATHOLOGY  COUNTS:  YES  TOURNIQUET:  * No tourniquets in log *  DICTATION: .Dragon Dictation  PLAN OF CARE: Discharge to home after PACU  PATIENT DISPOSITION:  PACU - hemodynamically stable.   Delay start of Pharmacological VTE agent (>24hrs) due to surgical blood loss or risk of bleeding: not applicable Details of procedure patient was taken operating room prepped and draped for vaginal procedure with weighted speculum inserted timeout conducted from the surgical team. Cervix could be grasped with single-tooth tenaculum and paracervical block applied with Marcaine solution. Uterus is quite anteflexed sounded to 11 cm, dilated to 21 [place] and a 0 scope inserted. Endometrial cavity was somewhat shaggy with tissue but surprisingly the anterior submucosal fibroid was not particularly prominent and did not get into the endometrial cavity sufficiently to warrant any removal techniques. Endometrial cavity was curetted and tissue fragments sent for histology. Repeat hysteroscopy showed a smooth endometrial cavity that would be amenable to placement of of an IUD. We will discuss IUD as an option for menstrual control upon follow-up in the office

## 2016-11-07 NOTE — Interval H&P Note (Signed)
History and Physical Interval Note:  11/07/2016 11:11 AM  Lori Roth  has presented today for surgery, with the diagnosis of [place]  The various methods of treatment have been discussed with the patient and family. After consideration of risks, benefits and other options for treatment, the patient has consented to  Procedure(s) with comments: [place] (N/A) - X LARGE SCOPE as a surgical intervention .  The patient's history has been reviewed, patient examined, no change in status, stable for surgery.  I have reviewed the patient's chart and labs.  Questions were answered to the patient's satisfaction.     Jonnie Kind

## 2016-11-08 ENCOUNTER — Encounter (HOSPITAL_COMMUNITY): Payer: Self-pay | Admitting: Obstetrics and Gynecology

## 2016-11-14 ENCOUNTER — Telehealth: Payer: Self-pay | Admitting: *Deleted

## 2016-11-14 NOTE — Telephone Encounter (Signed)
Patient called stating she is still having heavy bright red vaginal bleeding since her surgery,and it's not getting lighter. She is changing a pad about every 3 hours. Spoke with Dr Elonda Husky who stated to work patient in with Dr Glo Herring tomorrow to be seen. Pt verbalized understanding.

## 2016-11-15 ENCOUNTER — Ambulatory Visit (INDEPENDENT_AMBULATORY_CARE_PROVIDER_SITE_OTHER): Payer: BC Managed Care – PPO | Admitting: Obstetrics and Gynecology

## 2016-11-15 ENCOUNTER — Encounter: Payer: Self-pay | Admitting: Obstetrics and Gynecology

## 2016-11-15 ENCOUNTER — Encounter: Payer: BC Managed Care – PPO | Admitting: Obstetrics and Gynecology

## 2016-11-15 VITALS — BP 110/70 | HR 96 | Wt 137.8 lb

## 2016-11-15 DIAGNOSIS — N939 Abnormal uterine and vaginal bleeding, unspecified: Secondary | ICD-10-CM

## 2016-11-15 DIAGNOSIS — N920 Excessive and frequent menstruation with regular cycle: Secondary | ICD-10-CM

## 2016-11-15 MED ORDER — MEGESTROL ACETATE 40 MG PO TABS: 40.0000 mg | ORAL_TABLET | Freq: Three times a day (TID) | ORAL | 2 refills | Status: DC

## 2016-11-15 NOTE — Progress Notes (Signed)
   Subjective:  Lori Roth is a 56 y.o. female now 1 week status post D&C/hysteroscopy. She continues to have heavy bleeding. She is a candidate for IUD and we will do that on Monday at her scheduled appt.   Review of Systems Negative except vaginal bleeding   Diet:   normal   Bowel movements : normal.    Objective:  BP 110/70 (BP Location: Right Arm, Patient Position: Sitting, Cuff Size: Normal)   Pulse 96   Wt 137 lb 12.8 oz (62.5 kg)   LMP 10/03/2016 (Approximate)   BMI 23.65 kg/m  General:Well developed, well nourished.  No acute distress. Abdomen: Bowel sounds normal, soft, non-tender. Pelvic Exam: not indicated, will examine at next appointment in 5 days    Assessment:  Post-Op 1 week s/p D&C and hysteroscopy   AUB and menorrhagia    Plan:  1.Wound care discussed   2. . current medications: start Megace 40 mg TID 3. Activity restrictions: no sexual activity until IUD in place 4. return to work: not applicable. 5. Follow up in 5 days for IUD placement ,hgb check  By signing my name below, I, Sonum Patel, attest that this documentation has been prepared under the direction and in the presence of Jonnie Kind, MD. Electronically Signed: Sonum Patel, Education administrator. 11/15/16. 3:25 PM.  I personally performed the services described in this documentation, which was SCRIBED in my presence. The recorded information has been reviewed and considered accurate. It has been edited as necessary during review. Jonnie Kind, MD

## 2016-11-20 ENCOUNTER — Ambulatory Visit (INDEPENDENT_AMBULATORY_CARE_PROVIDER_SITE_OTHER): Payer: BC Managed Care – PPO | Admitting: Obstetrics and Gynecology

## 2016-11-20 ENCOUNTER — Encounter: Payer: Self-pay | Admitting: Obstetrics and Gynecology

## 2016-11-20 VITALS — BP 140/72 | HR 88 | Ht 64.0 in | Wt 138.0 lb

## 2016-11-20 DIAGNOSIS — Z3202 Encounter for pregnancy test, result negative: Secondary | ICD-10-CM | POA: Diagnosis not present

## 2016-11-20 DIAGNOSIS — D649 Anemia, unspecified: Secondary | ICD-10-CM | POA: Diagnosis not present

## 2016-11-20 DIAGNOSIS — Z30430 Encounter for insertion of intrauterine contraceptive device: Secondary | ICD-10-CM | POA: Diagnosis not present

## 2016-11-20 DIAGNOSIS — N939 Abnormal uterine and vaginal bleeding, unspecified: Secondary | ICD-10-CM

## 2016-11-20 LAB — POCT HEMOGLOBIN: HEMOGLOBIN: 9.8 g/dL — AB (ref 12.2–16.2)

## 2016-11-20 LAB — POCT URINE PREGNANCY: PREG TEST UR: NEGATIVE

## 2016-11-20 NOTE — Progress Notes (Signed)
Patient ID: Lori Roth, female   DOB: 03-02-1961, 56 y.o.   MRN: [id number]  [facility] CLINIC PROCEDURE NOTE- IUD INSERT  Lori Roth is a 56 y.o. G3P3 here for ([id number])Mirena IUD insertion  No GYN concerns.   Last pap smear was on 08/30/15 and was normal.  GC/CHL not on file  IUD Insertion Procedure Note Patient identified, informed consent performed, consent signed.   Discussed risks of irregular bleeding, cramping, infection, malpositioning or misplacement of the IUD outside the uterus which may require further procedure such as laparoscopy. Time out was performed.  Urine pregnancy test negative.  Speculum inserted, Cervix visualized.  Cleaned with Betadine x 2.   Grasped anteriorly with a single tooth tenaculum.  Uterus sounded to 10.5 cm.  Mirena IUD placed per manufacturer's recommendations.   Strings trimmed to 3 cm.  Tenaculum was removed, good hemostasis noted.  Patient tolerated procedure well.   Patient was given post-procedure instructions.  She was advised to have backup contraception for one week.  Patient was also asked to check IUD strings periodically and follow up in 4 weeks for IUD check or in 1 week if bleeding persists.   By signing my name below, I, Hansel Feinstein, attest that this documentation has been prepared under the direction and in the presence of Jonnie Kind, MD. Electronically Signed: Hansel Feinstein, ED Scribe. 11/20/16. 4:12 PM.  I personally performed the services described in this documentation, which was SCRIBED in my presence. The recorded information has been reviewed and considered accurate. It has been edited as necessary during review. Jonnie Kind, MD

## 2016-12-14 ENCOUNTER — Telehealth: Payer: Self-pay | Admitting: *Deleted

## 2016-12-14 NOTE — Telephone Encounter (Signed)
Patient called stating that she called on 4/17 to let someone know her IUD came out but no one called her back. I informed patient that I did not have a call from her in my in basket and apologized for no one getting back to her. She was scheduled for a follow-up check of the IUD tomorrow but since it's out doesn't know if she needs it. Advised patient that we could cancel her appointment for tomorrow and I would discuss with Dr Glo Herring what we needed to do next. Pt verbalized understanding.

## 2016-12-15 ENCOUNTER — Ambulatory Visit: Payer: BC Managed Care – PPO | Admitting: Obstetrics and Gynecology

## 2016-12-15 ENCOUNTER — Telehealth: Payer: Self-pay | Admitting: *Deleted

## 2016-12-15 NOTE — Telephone Encounter (Signed)
Informed patient that I spoke with Anderson Malta, NP who stated the IUD was placed to help with the bleeding and if she was not bleeding that she may not need it reinserted but if she is bleeding, she may want to have it put back in. Patient states she is not bleeding now and wants to see what happens when its out. States she will call if bleeding starts again.

## 2017-01-19 NOTE — Addendum Note (Signed)
Addendum  created 01/19/17 1126 by Lyn Hollingshead, MD   Sign clinical note

## 2017-03-06 ENCOUNTER — Other Ambulatory Visit: Payer: Self-pay | Admitting: Adult Health

## 2017-03-06 DIAGNOSIS — Z1231 Encounter for screening mammogram for malignant neoplasm of breast: Secondary | ICD-10-CM

## 2017-03-08 ENCOUNTER — Ambulatory Visit (HOSPITAL_COMMUNITY)
Admission: RE | Admit: 2017-03-08 | Discharge: 2017-03-08 | Disposition: A | Discharge status: Home / Self Care | Payer: BC Managed Care – PPO | Source: Ambulatory Visit | Attending: Adult Health | Admitting: Adult Health

## 2017-03-08 ENCOUNTER — Encounter (HOSPITAL_COMMUNITY): Payer: Self-pay | Admitting: Radiology

## 2017-03-08 DIAGNOSIS — Z1231 Encounter for screening mammogram for malignant neoplasm of breast: Secondary | ICD-10-CM | POA: Diagnosis present

## 2017-03-19 ENCOUNTER — Other Ambulatory Visit: Payer: BC Managed Care – PPO | Admitting: Adult Health

## 2017-03-21 ENCOUNTER — Ambulatory Visit (INDEPENDENT_AMBULATORY_CARE_PROVIDER_SITE_OTHER): Payer: BC Managed Care – PPO | Admitting: Adult Health

## 2017-03-21 ENCOUNTER — Encounter: Payer: Self-pay | Admitting: Adult Health

## 2017-03-21 VITALS — BP 100/70 | HR 82 | Ht 64.0 in | Wt 141.0 lb

## 2017-03-21 DIAGNOSIS — Z862 Personal history of diseases of the blood and blood-forming organs and certain disorders involving the immune mechanism: Secondary | ICD-10-CM | POA: Diagnosis not present

## 2017-03-21 DIAGNOSIS — Z1212 Encounter for screening for malignant neoplasm of rectum: Secondary | ICD-10-CM | POA: Diagnosis not present

## 2017-03-21 DIAGNOSIS — Z01419 Encounter for gynecological examination (general) (routine) without abnormal findings: Secondary | ICD-10-CM

## 2017-03-21 DIAGNOSIS — Z1211 Encounter for screening for malignant neoplasm of colon: Secondary | ICD-10-CM

## 2017-03-21 LAB — HEMOCCULT GUIAC POC 1CARD (OFFICE): Fecal Occult Blood, POC: NEGATIVE

## 2017-03-21 LAB — POCT HEMOGLOBIN: Hemoglobin: 13.5 g/dL (ref 12.2–16.2)

## 2017-03-21 NOTE — Patient Instructions (Signed)
Physical in 1 year Pap in 2020 Mammogram yearly Labs with PCP  Colonoscopy advised

## 2017-03-21 NOTE — Progress Notes (Signed)
Patient ID: Lori Roth, female   DOB: 19-Jun-1961, 56 y.o.   MRN: [id number] History of Present Illness: Lori Roth is a 56 year old white female, married, G3P3 in for a well woman gyn exam, she had a normal pap with negative HPV 08/30/15.She had some bleeding earlier this year and had D&C that showed secretory endometrium and then had IUD inserted by Dr Glo Herring 11/20/16 and it fell out several days later, no bleeding since 12/09/16. PCP is Dr Willey Blade.    Current Medications, Allergies, Past Medical History, Past Surgical History, Family History and Social History were reviewed in [employer] record.     Review of Systems: Patient denies any headaches, hearing loss, fatigue, blurred vision, shortness of breath, chest pain, abdominal pain, problems with bowel movements, urination, or intercourse. No joint pain or mood swings.    Physical Exam:BP 100/70 (BP Location: Right Arm, Patient Position: Sitting, Cuff Size: Small)   Pulse 82   Ht 5\' 4"  (1.626 m)   Wt 141 lb (64 kg)   LMP 12/09/2016   BMI 24.20 kg/m HGB finger stick 13.5. General:  Well developed, well nourished, no acute distress Skin:  Warm and dry Neck:  Midline trachea, normal thyroid, good ROM, no lymphadenopathy Lungs; Clear to auscultation bilaterally Breast:  No dominant palpable mass, retraction, or nipple discharge Cardiovascular: Regular rate and rhythm Abdomen:  Soft, non tender, no hepatosplenomegaly Pelvic:  External genitalia is normal in appearance, no lesions.  The vagina is normal in appearance. Urethra has no lesions or masses. The cervix is bulbous.  Uterus is felt to be normal size, shape, and contour.  No adnexal masses or tenderness noted.Bladder is non tender, no masses felt. Rectal: Good sphincter tone, no polyps, or hemorrhoids felt.  Hemoccult negative. Extremities/musculoskeletal:  No swelling or varicosities noted, no clubbing or cyanosis Psych:  No mood changes, alert and  cooperative,seems happy PHQ 2 score 0.  Impression: 1. Encounter for gynecological examination with Papanicolaou smear of cervix   2. Screening for colorectal cancer   3. History of anemia       Plan: Physical in 1 year Pap in 2020 Mammogram yearly Labs with PCP  Colonoscopy advised

## 2018-02-12 ENCOUNTER — Telehealth: Payer: Self-pay | Admitting: *Deleted

## 2018-02-12 ENCOUNTER — Other Ambulatory Visit: Payer: Self-pay | Admitting: Obstetrics & Gynecology

## 2018-02-12 MED ORDER — MEGESTROL ACETATE 40 MG PO TABS: ORAL_TABLET | ORAL | 3 refills | Status: DC

## 2018-02-12 NOTE — Telephone Encounter (Signed)
Megace was e prescribed

## 2018-02-13 NOTE — Telephone Encounter (Signed)
LMOVM that Megace was sent to pharmacy.

## 2018-02-18 ENCOUNTER — Other Ambulatory Visit: Payer: Self-pay | Admitting: Obstetrics and Gynecology

## 2018-02-18 DIAGNOSIS — Z1231 Encounter for screening mammogram for malignant neoplasm of breast: Secondary | ICD-10-CM

## 2018-03-06 ENCOUNTER — Ambulatory Visit: Payer: BC Managed Care – PPO | Admitting: Obstetrics and Gynecology

## 2018-03-06 ENCOUNTER — Encounter: Payer: Self-pay | Admitting: Obstetrics and Gynecology

## 2018-03-06 ENCOUNTER — Other Ambulatory Visit: Payer: Self-pay | Admitting: Obstetrics and Gynecology

## 2018-03-06 VITALS — BP 123/79 | HR 93 | Wt 139.2 lb

## 2018-03-06 DIAGNOSIS — N939 Abnormal uterine and vaginal bleeding, unspecified: Secondary | ICD-10-CM

## 2018-03-06 NOTE — Progress Notes (Signed)
Patient ID: NERINE PULSE, female   DOB: [birth year], 57 y.o.   MRN: [id number]   [place] [facility] Visit  @DATE @            Patient name: VANTASIA PINKNEY MRN [id number]  Date of birth: [birth year]  CC & HPI:  ZAREYA TUCKETT is a 57 y.o. female presenting today for vaginal bleeding. She had an hysteroscopy 11/07/16 with benign secretory endometrium encountered.  She had had a fibroid that was thought to be compressing the endometrial cavity but on hysteroscopy it did not appear to be altering the endometrium very much.   And is currently on megace but is still bleeding for 2 weeks at a time. She had a regular period in February and didn't have a period until the begining of June then bled for 2 weeks. She had leftover megace and took it for 3 days, bleeding subsides, then started bleeding again. She had an IUD placed on 11/20/16 but her body  "pushed it out" a few days later.  ROS:  ROS +vaginal bleeding -fever -chills All systems are negative except as noted in the HPI and PMH.   Pertinent History Reviewed:   Reviewed: Significant for D&C, Hysteroscopy Medical         Past Medical History:  Diagnosis Date  . ADD (attention deficit disorder)   . Anemia   . Dyspnea   . Headache    Migraines  . Irregular bleeding 08/30/2015                              Surgical Hx:    Past Surgical History:  Procedure Laterality Date  . DILATATION & CURETTAGE/HYSTEROSCOPY WITH MYOSURE N/A 11/07/2016   Procedure: DILATATION & CURETTAGE/HYSTEROSCOPY;  Surgeon: Jonnie Kind, MD;  Location: [facility];  Service: Gynecology;  Laterality: N/A;  X LARGE SCOPE  . WISDOM TOOTH EXTRACTION     Medications: Reviewed & Updated - see associated section                       Current Outpatient Medications:  .  megestrol (MEGACE) 40 MG tablet, 3 tablets a day for 5 days, 2 tablets a day for 5 days then 1 tablet daily, Disp: 45 tablet, Rfl: 3 .  Melatonin 5 MG TABS, Take 5 mg by mouth at bedtime as needed  (sleep)., Disp: , Rfl:  .  Multiple Vitamin (MULTIVITAMIN) tablet, Take 1 tablet by mouth daily., Disp: , Rfl:    Social History: Reviewed -  reports that she has never smoked. She has never used smokeless tobacco.  Objective Findings:  Vitals: Blood pressure 123/79, pulse 93, weight 139 lb 3.2 oz (63.1 kg).  PHYSICAL EXAMINATION General appearance - alert, well appearing, and in no distress and oriented to person, place, and time Mental status - alert, oriented to person, place, and time, normal mood, behavior, speech, dress, motor activity, and thought processes, affect appropriate to mood  PELVIC External genitalia - normal Vagina - Nomal Cervix - surgically absent, moderate amount of dar red blood, equal to a normal period Uterus -  anteverted uterus, Ultrasound:  Done, showed 1 small fibroid in anterior, 15mm endometrial stripe, with heterogeneous tissue.  Endometrial biopsy planned.  Will  Consent obtained verbally, confirmed later to sign patient consented we then proceeded with placing a paracervical block, x10 cc of lidocaine which allowed the patient to tolerate the cervical grasping well  but she still had moderate to severe pain with endometrial biopsy.  Generous amounts of tissue obtained ENDOMETRial biopsy done. Generous tissue obtained. 7 cc tissue and clots Assessment & Plan:   A:  1. Perimenopausal bleeding 2. S/p hysteroscopy 2018 with recurrent vaginal bleeding  3.  e  P:  1. F/u endo bx.  2. Phone call results/ my chart 3. F/u in 2 weeks   Patient gave verbal consent, then signed paperwork to be put in the chart, time out was performed. Appropriate time out taken. The patient was placed in the lithotomy position and the cervix brought into view with sterile speculum. 10 cc of lidocaine without epi was given to patient then portio of cervix cleansed x 2 with betadine swabs.  A tenaculum was placed in the anterior lip of the cervix.  The uterus was sounded for  depth of 9 cm. A pipelle was introduced to into the uterus, suction created,  and an endometrial sample was obtained. All equipment was removed and accounted for.  The patient tolerated the procedure well.    Patient given post procedure instructions.  Pt is to stop the megace, expect a menses x 3-5 days. The patient will return in 2 weeks for review of bleeding. And consider [place] Will give proveral q 3 months for withdrawal bleed til she stops responding to it, then treat as postmenopausal.  By signing my name below, I, Samul Dada, attest that this documentation has been prepared under the direction and in the presence of Jonnie Kind, MD. Electronically Signed: [place]. 03/06/18. 9:54 AM.  I personally performed the services described in this documentation, which was SCRIBED in my presence. The recorded information has been reviewed and considered accurate. It has been edited as necessary during review. Jonnie Kind, MD

## 2018-03-08 ENCOUNTER — Telehealth: Payer: Self-pay | Admitting: *Deleted

## 2018-03-08 NOTE — Telephone Encounter (Signed)
Verbal order called for Provera 10mg  daily for 14 days every 3 months called to pharmacy per Dr Glo Herring.

## 2018-03-11 ENCOUNTER — Encounter (HOSPITAL_COMMUNITY): Payer: Self-pay

## 2018-03-11 ENCOUNTER — Ambulatory Visit (HOSPITAL_COMMUNITY)
Admission: RE | Admit: 2018-03-11 | Discharge: 2018-03-11 | Disposition: A | Discharge status: Home / Self Care | Payer: BC Managed Care – PPO | Source: Ambulatory Visit | Attending: Obstetrics and Gynecology | Admitting: Obstetrics and Gynecology

## 2018-03-11 DIAGNOSIS — Z1231 Encounter for screening mammogram for malignant neoplasm of breast: Secondary | ICD-10-CM | POA: Insufficient documentation

## 2018-03-20 ENCOUNTER — Ambulatory Visit: Payer: BC Managed Care – PPO | Admitting: Obstetrics and Gynecology

## 2018-03-20 ENCOUNTER — Encounter: Payer: Self-pay | Admitting: Obstetrics and Gynecology

## 2018-03-20 VITALS — BP 109/69 | HR 83 | Ht 64.0 in | Wt 137.4 lb

## 2018-03-20 DIAGNOSIS — N939 Abnormal uterine and vaginal bleeding, unspecified: Secondary | ICD-10-CM | POA: Diagnosis not present

## 2018-03-20 DIAGNOSIS — Z862 Personal history of diseases of the blood and blood-forming organs and certain disorders involving the immune mechanism: Secondary | ICD-10-CM

## 2018-03-20 DIAGNOSIS — N951 Menopausal and female climacteric states: Secondary | ICD-10-CM

## 2018-03-20 NOTE — Progress Notes (Signed)
Patient ID: SMT LOKEY, female   DOB: Dec 14, 1960, 57 y.o.   MRN: [id number]    [place] Clinic Visit  @DATE @            Patient name: Lori Roth MRN [id number]  Date of birth: 11-21-1960  CC & HPI:  Lori Roth is a 57 y.o. female presenting today for follow up endometrial biopsy done on 03/06/18. [name] had abnormal uterine bleeding.  She was on megace when biopsy was done bled afterwards up until 3 days ago and is currently only taking Provera and no longer taking megace. Biopsy results showed benign endometrium. Endometrium, biopsy - BENIGN ENDOMETRIUM WITH STROMAL DECIDUALIZATION AND ATROPHIC GLANDS, CONSISTENT WITH HORMONAL EFFECT. - NO HYPERPLASIA OR MALIGNANCY IDENTIFIED. ROS:  ROS +perimenopausal -fever -chills All systems are negative except as noted in the HPI and PMH.    Pertinent History Reviewed:   Reviewed: Significant for hysteroscopy Medical         Past Medical History:  Diagnosis Date   ADD (attention deficit disorder)    Anemia    Dyspnea    Headache    Migraines   Irregular bleeding 08/30/2015                              Surgical Hx:    Past Surgical History:  Procedure Laterality Date   DILATATION & CURETTAGE/HYSTEROSCOPY WITH MYOSURE N/A 11/07/2016   Procedure: DILATATION & CURETTAGE/HYSTEROSCOPY;  Surgeon: Jonnie Kind, MD;  Location: [facility];  Service: Gynecology;  Laterality: N/A;  X LARGE SCOPE   WISDOM TOOTH EXTRACTION     Medications: Reviewed & Updated - see associated section                       Current Outpatient Medications:    medroxyPROGESTERone (PROVERA) 10 MG tablet, Take 10 mg by mouth daily., Disp: , Rfl:    Melatonin 5 MG TABS, Take 5 mg by mouth at bedtime as needed (sleep)., Disp: , Rfl:    Multiple Vitamin (MULTIVITAMIN) tablet, Take 1 tablet by mouth daily., Disp: , Rfl:    megestrol (MEGACE) 40 MG tablet, 3 tablets a day for 5 days, 2 tablets a day for 5 days then 1 tablet daily (Patient not  taking: Reported on 03/20/2018), Disp: 45 tablet, Rfl: 3   Social History: Reviewed -  reports that she has never smoked. She has never used smokeless tobacco.  Objective Findings:  Vitals: Blood pressure 109/69, pulse 83, height 5\' 4"  (1.626 m), weight 137 lb 6.4 oz (62.3 kg).  PHYSICAL EXAMINATION General appearance - alert, well appearing, and in no distress, oriented to person, place, and time and normal appearing weight Mental status - alert, oriented to person, place, and time, normal mood, behavior, speech, dress, motor activity, and thought processes, affect appropriate to mood  PELVIC NOT DONE  Assessment & Plan:   A:  1.  Perimenopausal status with biopsy proven benign endometrium 2. DUB>  P:  1. FSH testing 2. Provera every 3rd month    By signing my name below, I, Samul Dada, attest that this documentation has been prepared under the direction and in the presence of Jonnie Kind, MD. Electronically Signed: [place]. 03/20/18. 10:14 AM.  I personally performed the services described in this documentation, which was SCRIBED in my presence. The recorded information has been reviewed and considered accurate. It has been  edited as necessary during review. Jonnie Kind, MD

## 2018-03-20 NOTE — Progress Notes (Signed)
My notes are above under Lori Roth's note.

## 2018-03-21 LAB — FOLLICLE STIMULATING HORMONE: FSH: 36.8 m[IU]/mL

## 2019-02-24 ENCOUNTER — Other Ambulatory Visit (HOSPITAL_COMMUNITY): Payer: Self-pay | Admitting: Adult Health

## 2019-02-24 DIAGNOSIS — Z1231 Encounter for screening mammogram for malignant neoplasm of breast: Secondary | ICD-10-CM

## 2019-03-31 ENCOUNTER — Ambulatory Visit (HOSPITAL_COMMUNITY)
Admission: RE | Admit: 2019-03-31 | Discharge: 2019-03-31 | Disposition: A | Discharge status: Home / Self Care | Payer: BC Managed Care – PPO | Source: Ambulatory Visit | Attending: Adult Health | Admitting: Adult Health

## 2019-03-31 ENCOUNTER — Other Ambulatory Visit: Payer: Self-pay

## 2019-03-31 DIAGNOSIS — Z1231 Encounter for screening mammogram for malignant neoplasm of breast: Secondary | ICD-10-CM | POA: Insufficient documentation

## 2019-10-19 ENCOUNTER — Ambulatory Visit: Payer: BC Managed Care – PPO | Attending: Internal Medicine

## 2019-10-19 DIAGNOSIS — Z23 Encounter for immunization: Secondary | ICD-10-CM | POA: Insufficient documentation

## 2019-10-19 NOTE — Progress Notes (Signed)
   Covid-19 Vaccination Clinic  Name:  Lori Roth    MRN: [id number] DOB: [birth year]  10/19/2019  Ms. Haston was observed post Covid-19 immunization for 15 minutes without incident. She was provided with Vaccine Information Sheet and instruction to access the V-Safe system.   [name] was instructed to call 911 with any severe reactions post vaccine: Marland Kitchen Difficulty breathing  . Swelling of face and throat  . A fast heartbeat  . A bad rash all over body  . Dizziness and weakness   Immunizations Administered    Name Date Dose VIS Date Route   Pfizer COVID-19 Vaccine 10/19/2019 11:45 AM 0.3 mL 07/25/2019 Intramuscular   Manufacturer: [place]   Lot: [id number]   [place]: [id number]

## 2019-11-09 ENCOUNTER — Ambulatory Visit: Payer: BC Managed Care – PPO | Attending: Internal Medicine

## 2019-11-09 ENCOUNTER — Other Ambulatory Visit: Payer: Self-pay

## 2019-11-09 DIAGNOSIS — Z23 Encounter for immunization: Secondary | ICD-10-CM

## 2019-11-09 NOTE — Progress Notes (Signed)
   Covid-19 Vaccination Clinic  Name:  Lori Roth    MRN: [id number] DOB: October 16, 1960  11/09/2019  [name] was observed post Covid-19 immunization for 15 minutes without incident. She was provided with Vaccine Information Sheet and instruction to access the V-Safe system.   [name] was instructed to call 911 with any severe reactions post vaccine: Marland Kitchen Difficulty breathing  . Swelling of face and throat  . A fast heartbeat  . A bad rash all over body  . Dizziness and weakness   Immunizations Administered    Name Date Dose VIS Date Route   Pfizer COVID-19 Vaccine 11/09/2019 11:41 AM 0.3 mL 07/25/2019 Intramuscular   Manufacturer: [place]   Lot: [id number]   [place]: [id number]

## 2020-03-01 ENCOUNTER — Other Ambulatory Visit (HOSPITAL_COMMUNITY): Payer: Self-pay | Admitting: Adult Health

## 2020-03-01 DIAGNOSIS — Z1231 Encounter for screening mammogram for malignant neoplasm of breast: Secondary | ICD-10-CM

## 2020-03-30 ENCOUNTER — Other Ambulatory Visit (HOSPITAL_COMMUNITY)
Admission: RE | Admit: 2020-03-30 | Discharge: 2020-03-30 | Disposition: A | Discharge status: Home / Self Care | Payer: BC Managed Care – PPO | Source: Ambulatory Visit | Attending: Adult Health | Admitting: Adult Health

## 2020-03-30 ENCOUNTER — Ambulatory Visit (INDEPENDENT_AMBULATORY_CARE_PROVIDER_SITE_OTHER): Payer: BC Managed Care – PPO | Admitting: Adult Health

## 2020-03-30 ENCOUNTER — Encounter: Payer: Self-pay | Admitting: Adult Health

## 2020-03-30 ENCOUNTER — Other Ambulatory Visit: Payer: Self-pay

## 2020-03-30 VITALS — BP 136/71 | HR 70 | Ht 64.0 in | Wt 141.0 lb

## 2020-03-30 DIAGNOSIS — Z01419 Encounter for gynecological examination (general) (routine) without abnormal findings: Secondary | ICD-10-CM | POA: Insufficient documentation

## 2020-03-30 DIAGNOSIS — Z1211 Encounter for screening for malignant neoplasm of colon: Secondary | ICD-10-CM | POA: Diagnosis not present

## 2020-03-30 DIAGNOSIS — Z1212 Encounter for screening for malignant neoplasm of rectum: Secondary | ICD-10-CM | POA: Diagnosis not present

## 2020-03-30 LAB — HEMOCCULT GUIAC POC 1CARD (OFFICE): Fecal Occult Blood, POC: NEGATIVE

## 2020-03-30 NOTE — Progress Notes (Signed)
Patient ID: Lori Roth, female   DOB: 05-Jun-1961, 59 y.o.   MRN: [id number] History of Present Illness: Lori Roth is a 59 year old white female,married, G3P3, PM in for a well woman gyn exam and pap. 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 She did get both vaccines for COVID.  PCP is Dr Willey Blade.   Current Medications, Allergies, Past Medical History, Past Surgical History, Family History and Social History were reviewed in [employer] record.     Review of Systems: Patient denies any headaches, hearing loss, fatigue, blurred vision, shortness of breath, chest pain, abdominal pain, problems with bowel movements, urination, or intercourse. No joint pain or mood swings. No vaginal bleeding    Physical Exam:BP 136/71 (BP Location: Left Arm, Patient Position: Sitting, Cuff Size: Normal)   Pulse 70   Ht 5\' 4"  (1.626 m)   Wt 141 lb (64 kg)   LMP 12/09/2016   BMI 24.20 kg/m  General:  Well developed, well nourished, no acute distress Skin:  Warm and dry Neck:  Midline trachea, normal thyroid, good ROM, no lymphadenopathy Lungs; Clear to auscultation bilaterally Breast:  No dominant palpable mass, retraction, or nipple discharge Cardiovascular: Regular rate and rhythm Abdomen:  Soft, non tender, no hepatosplenomegaly Pelvic:  External genitalia is normal in appearance, no lesions.  The vagina is normal in appearance. Urethra has no lesions or masses. The cervix is smooth, pap with high risk HPV genotyping performed.  Uterus is felt to be normal size, shape, and contour.  No adnexal masses or tenderness noted.Bladder is non tender, no masses felt. Rectal: Good sphincter tone, no polyps, or hemorrhoids felt.  Hemoccult negative. Extremities/musculoskeletal:  No swelling or varicosities noted, no clubbing or cyanosis Psych:  No mood changes, alert and cooperative,seems happy Fall risk is low AA is 0 PHQ 9 score is 2  [place] - 03/30/20 1520      Pregnancy Intention Screening   Does  the patient want to become pregnant in the next year? No    Does the patient's partner want to become pregnant in the next year? No    Would the patient like to discuss contraceptive options today? No      Contraception Wrap Up   Current Method Vasectomy    End Method Vasectomy    Contraception Counseling Provided No          Examination chaperoned by Lori Squibb LPN   Impression and Plan:  1. Encounter for gynecological examination with Papanicolaou smear of cervix Pap sent Physical in 1 year  Pap in 3 if normal Mammogram yearly Call if wants fasting labs   2. Encounter for screening fecal occult blood testing   3. Screening for colorectal cancer Referred to Dr Laural Golden for colonoscopy

## 2020-03-31 ENCOUNTER — Encounter (INDEPENDENT_AMBULATORY_CARE_PROVIDER_SITE_OTHER): Payer: Self-pay | Admitting: *Deleted

## 2020-04-01 LAB — CYTOLOGY - PAP
Comment: NEGATIVE
Diagnosis: NEGATIVE
High risk HPV: NEGATIVE

## 2020-04-05 ENCOUNTER — Other Ambulatory Visit: Payer: Self-pay

## 2020-04-05 ENCOUNTER — Ambulatory Visit (HOSPITAL_COMMUNITY)
Admission: RE | Admit: 2020-04-05 | Discharge: 2020-04-05 | Disposition: A | Discharge status: Home / Self Care | Payer: BC Managed Care – PPO | Source: Ambulatory Visit | Attending: Adult Health | Admitting: Adult Health

## 2020-04-05 DIAGNOSIS — Z1231 Encounter for screening mammogram for malignant neoplasm of breast: Secondary | ICD-10-CM | POA: Diagnosis not present

## 2020-04-06 ENCOUNTER — Other Ambulatory Visit (HOSPITAL_COMMUNITY): Payer: Self-pay | Admitting: Adult Health

## 2020-04-06 DIAGNOSIS — R928 Other abnormal and inconclusive findings on diagnostic imaging of breast: Secondary | ICD-10-CM

## 2020-04-13 ENCOUNTER — Other Ambulatory Visit: Payer: Self-pay

## 2020-04-13 ENCOUNTER — Ambulatory Visit (HOSPITAL_COMMUNITY)
Admission: RE | Admit: 2020-04-13 | Discharge: 2020-04-13 | Disposition: A | Discharge status: Home / Self Care | Payer: BC Managed Care – PPO | Source: Ambulatory Visit | Attending: Adult Health | Admitting: Adult Health

## 2020-04-13 DIAGNOSIS — R928 Other abnormal and inconclusive findings on diagnostic imaging of breast: Secondary | ICD-10-CM | POA: Diagnosis not present

## 2020-07-19 ENCOUNTER — Other Ambulatory Visit (INDEPENDENT_AMBULATORY_CARE_PROVIDER_SITE_OTHER): Payer: Self-pay

## 2020-07-19 ENCOUNTER — Telehealth (INDEPENDENT_AMBULATORY_CARE_PROVIDER_SITE_OTHER): Payer: Self-pay

## 2020-07-19 DIAGNOSIS — Z1211 Encounter for screening for malignant neoplasm of colon: Secondary | ICD-10-CM

## 2020-07-21 NOTE — Telephone Encounter (Signed)
LeighAnn Ilynn Stauffer, CMA  

## 2020-07-27 ENCOUNTER — Telehealth (INDEPENDENT_AMBULATORY_CARE_PROVIDER_SITE_OTHER): Payer: Self-pay

## 2020-07-27 ENCOUNTER — Encounter (INDEPENDENT_AMBULATORY_CARE_PROVIDER_SITE_OTHER): Payer: Self-pay

## 2020-07-27 DIAGNOSIS — Z1211 Encounter for screening for malignant neoplasm of colon: Secondary | ICD-10-CM

## 2020-07-27 MED ORDER — PLENVU 140 G PO SOLR: 280.0000 g | Freq: Once | ORAL | 0 refills | Status: AC

## 2020-07-27 NOTE — Telephone Encounter (Signed)
Lori Roth, CMA  

## 2020-07-27 NOTE — Telephone Encounter (Signed)
Referring MD/PCP: Willey Blade   Procedure: Tcs  Reason/Indication:  Screening  Has patient had this procedure before?  no  If so, when, by whom and where?    Is there a family history of colon cancer?  no  Who?  What age when diagnosed?    Is patient diabetic?   no      Does patient have prosthetic heart valve or mechanical valve?  no  Do you have a pacemaker/defibrillator?  no  Has patient ever had endocarditis/atrial fibrillation? no  Have you had a stroke/heart attack last 6 mths? no  Does patient use oxygen? no  Has patient had joint replacement within last 12 months?  no  Is patient constipated or do they take laxatives? no  Does patient have a history of alcohol/drug use?  no  Is patient on blood thinner such as Coumadin, Plavix and/or Aspirin? no  Medications: multi vit   Allergies: nkda  Procedure date & time: 08/26/20 1:00

## 2020-08-25 ENCOUNTER — Other Ambulatory Visit (HOSPITAL_COMMUNITY)
Admission: RE | Admit: 2020-08-25 | Discharge: 2020-08-25 | Disposition: A | Discharge status: Home / Self Care | Payer: BC Managed Care – PPO | Source: Ambulatory Visit | Attending: Internal Medicine | Admitting: Internal Medicine

## 2020-08-25 ENCOUNTER — Other Ambulatory Visit: Payer: Self-pay

## 2020-08-25 DIAGNOSIS — Z01812 Encounter for preprocedural laboratory examination: Secondary | ICD-10-CM | POA: Insufficient documentation

## 2020-08-25 DIAGNOSIS — K6289 Other specified diseases of anus and rectum: Secondary | ICD-10-CM | POA: Diagnosis not present

## 2020-08-25 DIAGNOSIS — D120 Benign neoplasm of cecum: Secondary | ICD-10-CM | POA: Diagnosis not present

## 2020-08-25 DIAGNOSIS — K5730 Diverticulosis of large intestine without perforation or abscess without bleeding: Secondary | ICD-10-CM | POA: Diagnosis not present

## 2020-08-25 DIAGNOSIS — Z1211 Encounter for screening for malignant neoplasm of colon: Secondary | ICD-10-CM | POA: Diagnosis present

## 2020-08-25 DIAGNOSIS — Z20822 Contact with and (suspected) exposure to covid-19: Secondary | ICD-10-CM | POA: Insufficient documentation

## 2020-08-25 LAB — SARS CORONAVIRUS 2 (TAT 6-24 HRS): SARS Coronavirus 2: NEGATIVE

## 2020-08-26 ENCOUNTER — Ambulatory Visit (HOSPITAL_COMMUNITY)
Admission: RE | Admit: 2020-08-26 | Discharge: 2020-08-26 | Disposition: A | Discharge status: Home / Self Care | Payer: BC Managed Care – PPO | Attending: Internal Medicine | Admitting: Internal Medicine

## 2020-08-26 ENCOUNTER — Encounter (HOSPITAL_COMMUNITY)
Admission: RE | Disposition: A | Discharge status: Home / Self Care | Payer: Self-pay | Source: Home / Self Care | Attending: Internal Medicine

## 2020-08-26 ENCOUNTER — Encounter (HOSPITAL_COMMUNITY): Payer: Self-pay | Admitting: Internal Medicine

## 2020-08-26 ENCOUNTER — Other Ambulatory Visit: Payer: Self-pay

## 2020-08-26 DIAGNOSIS — Z1211 Encounter for screening for malignant neoplasm of colon: Secondary | ICD-10-CM

## 2020-08-26 DIAGNOSIS — K5730 Diverticulosis of large intestine without perforation or abscess without bleeding: Secondary | ICD-10-CM | POA: Diagnosis not present

## 2020-08-26 DIAGNOSIS — D120 Benign neoplasm of cecum: Secondary | ICD-10-CM | POA: Insufficient documentation

## 2020-08-26 DIAGNOSIS — K6289 Other specified diseases of anus and rectum: Secondary | ICD-10-CM | POA: Insufficient documentation

## 2020-08-26 DIAGNOSIS — Z20822 Contact with and (suspected) exposure to covid-19: Secondary | ICD-10-CM | POA: Insufficient documentation

## 2020-08-26 DIAGNOSIS — Z1212 Encounter for screening for malignant neoplasm of rectum: Secondary | ICD-10-CM

## 2020-08-26 HISTORY — PX: COLONOSCOPY: SHX5424

## 2020-08-26 HISTORY — PX: POLYPECTOMY: SHX5525

## 2020-08-26 LAB — HM COLONOSCOPY

## 2020-08-26 SURGERY — COLONOSCOPY
Anesthesia: Moderate Sedation

## 2020-08-26 MED ORDER — CHLORHEXIDINE GLUCONATE CLOTH 2 % EX PADS: 6.0000 | MEDICATED_PAD | Freq: Once | CUTANEOUS | Status: DC

## 2020-08-26 MED ORDER — MEPERIDINE HCL 50 MG/ML IJ SOLN
INTRAMUSCULAR | Status: DC | PRN
  Administered 2020-08-26 (×2): 25 mg via INTRAVENOUS

## 2020-08-26 MED ORDER — SODIUM CHLORIDE 0.9 % IV SOLN: INTRAVENOUS | Status: DC

## 2020-08-26 MED ORDER — MEPERIDINE HCL 50 MG/ML IJ SOLN
INTRAMUSCULAR | Status: AC
  Filled 2020-08-26: qty 1

## 2020-08-26 MED ORDER — MIDAZOLAM HCL 5 MG/5ML IJ SOLN
INTRAMUSCULAR | Status: DC | PRN
  Administered 2020-08-26: 1 mg via INTRAVENOUS
  Administered 2020-08-26 (×3): 2 mg via INTRAVENOUS

## 2020-08-26 MED ORDER — MIDAZOLAM HCL 5 MG/5ML IJ SOLN
INTRAMUSCULAR | Status: AC
  Filled 2020-08-26: qty 10

## 2020-08-26 MED ORDER — STERILE WATER FOR IRRIGATION IR SOLN
Status: DC | PRN
  Administered 2020-08-26: 1.5 mL

## 2020-08-26 NOTE — H&P (Signed)
Lori Roth is an 60 y.o. female.   Chief Complaint: Patient is here for colonoscopy HPI: Patient is a 60 year old Caucasian female who is here for screening colonoscopy.  This is patient's 1st exam.  She denies abdominal pain change in bowel habits or rectal bleeding.  She does not take any prescription medications. Family history is negative for CRC.  Past Medical History:  Diagnosis Date  . ADD (attention deficit disorder)           . Headache    Migraines    08/30/2015    Past Surgical History:  Procedure Laterality Date  . DILATATION & CURETTAGE/HYSTEROSCOPY WITH MYOSURE N/A 11/07/2016   Procedure: DILATATION & CURETTAGE/HYSTEROSCOPY;  Surgeon: Jonnie Kind, MD;  Location: [facility];  Service: Gynecology;  Laterality: N/A;  X LARGE SCOPE  . WISDOM TOOTH EXTRACTION      Family History  Problem Relation Age of Onset  . Diabetes Mother   . Hypertension Mother    Social History:  reports that she has never smoked. She has never used smokeless tobacco. She reports that she does not drink alcohol and does not use drugs.  Allergies: No Known Allergies  No medications prior to admission.    Results for orders placed or performed during the hospital encounter of 08/25/20 (from the past 48 hour(s))  SARS CORONAVIRUS 2 (TAT 6-24 HRS) Nasopharyngeal Nasopharyngeal Swab     Status: None   Collection Time: 08/25/20  8:45 AM   Specimen: Nasopharyngeal Swab  Result Value Ref Range   SARS Coronavirus 2 NEGATIVE NEGATIVE    Comment: (NOTE) SARS-CoV-2 target nucleic acids are NOT DETECTED.  The SARS-CoV-2 RNA is generally detectable in upper and lower respiratory specimens during the acute phase of infection. Negative results do not preclude SARS-CoV-2 infection, do not rule out co-infections with other pathogens, and should not be used as the sole basis for treatment or other patient management decisions. Negative results must be combined with clinical observations, patient  history, and epidemiological information. The expected result is Negative.  Fact Sheet for Patients: SugarRoll.be  Fact Sheet for Healthcare Providers: https://www.woods-mathews.com/  This test is not yet approved or cleared by the [place] FDA and  has been authorized for detection and/or diagnosis of SARS-CoV-2 by FDA under an Emergency Use Authorization (EUA). This EUA will remain  in effect (meaning this test can be used) for the duration of the COVID-19 declaration under Se ction 564(b)(1) of the Act, 21 U.S.C. section 360bbb-3(b)(1), unless the authorization is terminated or revoked sooner.  Performed at [facility], [place] [address] [place], [place] [address]    No results found.  Review of Systems  Blood pressure 133/64, pulse 79, temperature 98.1 F (36.7 C), temperature source Oral, resp. rate 15, height 5\' 4"  (1.626 m), last menstrual period 12/09/2016, SpO2 99 %. Physical Exam HENT:     Mouth/Throat:     Mouth: Mucous membranes are moist.     Pharynx: Oropharynx is clear.  Eyes:     General: No scleral icterus.    Conjunctiva/sclera: Conjunctivae normal.  Cardiovascular:     Rate and Rhythm: Normal rate and regular rhythm.     Heart sounds: Normal heart sounds. No murmur heard.   Pulmonary:     Effort: Pulmonary effort is normal.     Breath sounds: Normal breath sounds.  Abdominal:     General: There is no distension.     Palpations: Abdomen is soft. There is no mass.  Tenderness: There is no abdominal tenderness.  Musculoskeletal:     Cervical back: Neck supple.  Lymphadenopathy:     Cervical: No cervical adenopathy.  Skin:    General: Skin is warm and dry.     Comments: She has a maculopapular rash over her neck  Neurological:     Mental Status: She is alert.      Assessment/Plan  Average risk screening colonoscopy.  Hildred Laser, MD 08/26/2020, 10:37 AM

## 2020-08-26 NOTE — Op Note (Signed)
[facility] Patient Name: Lori Roth Procedure Date: 08/26/2020 10:25 AM MRN: [id number] Date of Birth: 1960-11-19 Attending MD: Hildred Laser , MD CSN: [id number] Age: 60 Admit Type: Outpatient Procedure:                Colonoscopy Indications:              Screening for colorectal malignant neoplasm Providers:                Hildred Laser, MD, Caprice Kluver, Kristine L. Risa Grill, Technician, Randa Spike, [employer]chant [employer] officer Referring MD:             Asencion Noble, MD Medicines:                Meperidine 50 mg IV, Midazolam 7 mg IV Complications:            No immediate complications. Estimated Blood Loss:     Estimated blood loss was minimal. Procedure:                Pre-Anesthesia Assessment:                           - Prior to the procedure, a History and Physical                            was performed, and patient medications and                            allergies were reviewed. The patient's tolerance of                            previous anesthesia was also reviewed. The risks                            and benefits of the procedure and the sedation                            options and risks were discussed with the patient.                            All questions were answered, and informed consent                            was obtained. Prior Anticoagulants: The patient has                            taken no previous anticoagulant or antiplatelet                            agents. ASA Grade Assessment: I - A normal, healthy                            patient. After reviewing the risks and benefits,  the patient was deemed in satisfactory condition to                            undergo the procedure.                           After obtaining informed consent, the colonoscope                            was passed under direct vision. Throughout the                            procedure, the patient's blood pressure,  pulse, and                            oxygen saturations were monitored continuously. The                            [id number]-[id number] ([id number]) scope was introduced through                            the anus and advanced to the the cecum, identified                            by appendiceal orifice and ileocecal valve. The                            colonoscopy was somewhat difficult due to                            restricted mobility of the colon and significant                            looping. Successful completion of the procedure was                            aided by withdrawing the scope and replacing with                            the 'babyscope'. The patient tolerated the                            procedure well. The quality of the bowel                            preparation was excellent except the ascending                            colon was fair. Scope In: 10:52:43 AM Scope Out: 11:13:49 AM Scope Withdrawal Time: 0 hours 7 minutes 50 seconds  Total Procedure Duration: 0 hours 21 minutes 6 seconds  Findings:      The perianal and digital rectal examinations were normal.      A diminutive polyp was found in the cecum. Biopsies were taken with  a       cold forceps for histology.      Three small-mouthed diverticula were found in the sigmoid colon and       hepatic flexure.      Anal papilla hypertrophied. Impression:               - One diminutive polyp in the cecum. Biopsied.                           - Diverticulosis in the sigmoid colon and at the                            hepatic flexure (1 small diverticulum at hepatic                            flexure and 2 at sigmoid colon)                           - Small anal papilla Moderate Sedation:      Moderate (conscious) sedation was administered by the endoscopy nurse       and supervised by the endoscopist. The following parameters were       monitored: oxygen saturation, heart rate, blood pressure, CO2        capnography and response to care. Total physician intraservice time was       26 minutes. Recommendation:           - Patient has a contact number available for                            emergencies. The signs and symptoms of potential                            delayed complications were discussed with the                            patient. Return to normal activities tomorrow.                            Written discharge instructions were provided to the                            patient.                           - High fiber diet today.                           - Await pathology results.                           - Repeat colonoscopy is recommended. The                            colonoscopy date will be determined after pathology  results from today's exam become available for                            review. Procedure Code(s):        --- Professional ---                           [phone], Colonoscopy, flexible; with biopsy, single                            or multiple                           99153, Moderate sedation; each additional 15                            minutes intraservice time                           G0500, Moderate sedation services provided by the                            same physician or other qualified health care                            professional performing a gastrointestinal                            endoscopic service that sedation supports,                            requiring the presence of an independent trained                            observer to assist in the monitoring of the                            patient's level of consciousness and physiological                            status; initial 15 minutes of intra-service time;                            patient age 55 years or older (additional time may                            be reported with [phone], as appropriate) Diagnosis Code(s):        ---  Professional ---                           Z12.11, Encounter for screening for malignant                            neoplasm of colon  K63.5, Polyp of colon                           K57.30, Diverticulosis of large intestine without                            perforation or abscess without bleeding CPT copyright 2019 American Medical Association. All rights reserved. The codes documented in this report are preliminary and upon coder review may  be revised to meet current compliance requirements. Hildred Laser, MD Hildred Laser, MD 08/26/2020 11:24:03 AM This report has been signed electronically. Number of Addenda: 0

## 2020-08-26 NOTE — Discharge Instructions (Signed)
Colonoscopy, Adult, Care After This sheet gives you information about how to care for yourself after your procedure. Your doctor may also give you more specific instructions. If you have problems or questions, call your doctor. What can I expect after the procedure? After the procedure, it is common to have:  A small amount of blood in your poop (stool) for 24 hours.  Some gas.  Mild cramping or bloating in your belly (abdomen). Follow these instructions at home: Eating and drinking  Drink enough fluid to keep your pee (urine) pale yellow.  Follow instructions from your doctor about what you cannot eat or drink.  Return to your normal diet as told by your doctor. Avoid heavy or fried foods that are hard to digest.   Activity  Rest as told by your doctor.  Do not sit for a long time without moving. Get up to take short walks every 1-2 hours. This is important. Ask for help if you feel weak or unsteady.  Return to your normal activities as told by your doctor. Ask your doctor what activities are safe for you. To help cramping and bloating:  Try walking around.  Put heat on your belly as told by your doctor. Use the heat source that your doctor recommends, such as a moist heat pack or a heating pad. ? Put a towel between your skin and the heat source. ? Leave the heat on for 20-30 minutes. ? Remove the heat if your skin turns bright red. This is very important if you are unable to feel pain, heat, or cold. You may have a greater risk of getting burned.   General instructions  If you were given a medicine to help you relax (sedative) during your procedure, it can affect you for many hours. Do not drive or use machinery until your doctor says that it is safe.  For the first 24 hours after the procedure: ? Do not sign important documents. ? Do not drink alcohol. ? Do your daily activities more slowly than normal. ? Eat foods that are soft and easy to digest.  Take  over-the-counter or prescription medicines only as told by your doctor.  Keep all follow-up visits as told by your doctor. This is important. Contact a doctor if:  You have blood in your poop 2-3 days after the procedure. Get help right away if:  You have more than a small amount of blood in your poop.  You see large clumps of tissue (blood clots) in your poop.  Your belly is swollen.  You feel like you may vomit (nauseous).  You vomit.  You have a fever.  You have belly pain that gets worse, and medicine does not help your pain. Summary  After the procedure, it is common to have a small amount of blood in your poop. You may also have mild cramping and bloating in your belly.  If you were given a medicine to help you relax (sedative) during your procedure, it can affect you for many hours. Do not drive or use machinery until your doctor says that it is safe.  Get help right away if you have a lot of blood in your poop, feel like you may vomit, have a fever, or have more belly pain. This information is not intended to replace advice given to you by your health care provider. Make sure you discuss any questions you have with your health care provider. Document Revised: 06/06/2019 Document Reviewed: 02/24/2019 Elsevier Patient Education  2021  [place].   No aspirin or NSAIDs for 24 hours High fiber diet No driving for 24 hours. Physician will call with biopsy results and further recommendations.   Colon Polyps  Colon polyps are tissue growths inside the colon, which is part of the large intestine. They are one of the types of polyps that can grow in the body. A polyp may be a round bump or a mushroom-shaped growth. You could have one polyp or more than one. Most colon polyps are noncancerous (benign). However, some colon polyps can become cancerous over time. Finding and removing the polyps early can help prevent this. What are the causes? The exact cause of colon polyps  is not known. What increases the risk? The following factors may make you more likely to develop this condition:  Having a family history of colorectal cancer or colon polyps.  Being older than 59 years of age.  Being younger than 59 years of age and having a significant family history of colorectal cancer or colon polyps or a genetic condition that puts you at higher risk of getting colon polyps.  Having inflammatory bowel disease, such as ulcerative colitis or Crohn's disease.  Having certain conditions passed from parent to child (hereditary conditions), such as: ? Familial adenomatous polyposis (FAP). ? Lynch syndrome. ? Turcot syndrome. ? Peutz-Jeghers syndrome. ? MUTYH-associated polyposis (MAP).  Being overweight.  Certain lifestyle factors. These include smoking cigarettes, drinking too much alcohol, not getting enough exercise, and eating a diet that is high in fat and red meat and low in fiber.  Having had childhood cancer that was treated with radiation of the abdomen. What are the signs or symptoms? Many times, there are no symptoms. If you have symptoms, they may include:  Blood coming from the rectum during a bowel movement.  Blood in the stool (feces). The blood may be bright red or very dark in color.  Pain in the abdomen.  A change in bowel habits, such as constipation or diarrhea. How is this diagnosed? This condition is diagnosed with a colonoscopy. This is a procedure in which a lighted, flexible scope is inserted into the opening between the buttocks (anus) and then passed into the colon to examine the area. Polyps are sometimes found when a colonoscopy is done as part of routine cancer screening tests. How is this treated? This condition is treated by removing any polyps that are found. Most polyps can be removed during a colonoscopy. Those polyps will then be tested for cancer. Additional treatment may be needed depending on the results of testing. Follow  these instructions at home: Eating and drinking  Eat foods that are high in fiber, such as fruits, vegetables, and whole grains.  Eat foods that are high in calcium and vitamin D, such as milk, cheese, yogurt, eggs, liver, fish, and broccoli.  Limit foods that are high in fat, such as fried foods and desserts.  Limit the amount of red meat, precooked or cured meat, or other processed meat that you eat, such as hot dogs, sausages, bacon, or meat loaves.  Limit sugary drinks.   Lifestyle  Maintain a healthy weight, or lose weight if recommended by your health care provider.  Exercise every day or as told by your health care provider.  Do not use any products that contain nicotine or tobacco, such as cigarettes, e-cigarettes, and chewing tobacco. If you need help quitting, ask your health care provider.  Do not drink alcohol if: ? Your health care provider  tells you not to drink. ? You are pregnant, may be pregnant, or are planning to become pregnant.  If you drink alcohol: ? Limit how much you use to:  0-1 drink a day for women.  0-2 drinks a day for men. ? Know how much alcohol is in your drink. In the [place] one drink equals one 12 oz bottle of beer (355 mL), one 5 oz glass of wine (148 mL), or one 1 oz glass of hard liquor (44 mL). General instructions  Take over-the-counter and prescription medicines only as told by your health care provider.  Keep all follow-up visits. This is important. This includes having regularly scheduled colonoscopies. Talk to your health care provider about when you need a colonoscopy. Contact a health care provider if:  You have new or worsening bleeding during a bowel movement.  You have new or increased blood in your stool.  You have a change in bowel habits.  You lose weight for no known reason. Summary  Colon polyps are tissue growths inside the colon, which is part of the large intestine. They are one type of polyp that can grow in the  body.  Most colon polyps are noncancerous (benign), but some can become cancerous over time.  This condition is diagnosed with a colonoscopy.  This condition is treated by removing any polyps that are found. Most polyps can be removed during a colonoscopy. This information is not intended to replace advice given to you by your health care provider. Make sure you discuss any questions you have with your health care provider. Document Revised: 11/19/2019 Document Reviewed: 11/19/2019 Elsevier Patient Education  2021 [place].   Diverticulosis  Diverticulosis is a condition that develops when small pouches (diverticula) form in the wall of the large intestine (colon). The colon is where water is absorbed and stool (feces) is formed. The pouches form when the inside layer of the colon pushes through weak spots in the outer layers of the colon. You may have a few pouches or many of them. The pouches usually do not cause problems unless they become inflamed or infected. When this happens, the condition is called diverticulitis. What are the causes? The cause of this condition is not known. What increases the risk? The following factors may make you more likely to develop this condition:  Being older than age 77. Your risk for this condition increases with age. Diverticulosis is rare among people younger than age 60. By age 7, many people have it.  Eating a low-fiber diet.  Having frequent constipation.  Being overweight.  Not getting enough exercise.  Smoking.  Taking over-the-counter pain medicines, like aspirin and ibuprofen.  Having a family history of diverticulosis. What are the signs or symptoms? In most people, there are no symptoms of this condition. If you do have symptoms, they may include:  Bloating.  Cramps in the abdomen.  Constipation or diarrhea.  Pain in the lower left side of the abdomen. How is this diagnosed? Because diverticulosis usually has no  symptoms, it is most often diagnosed during an exam for other colon problems. The condition may be diagnosed by:  Using a flexible scope to examine the colon (colonoscopy).  Taking an X-ray of the colon after dye has been put into the colon (barium enema).  Having a CT scan. How is this treated? You may not need treatment for this condition. Your health care provider may recommend treatment to prevent problems. You may need treatment if you have symptoms  or if you previously had diverticulitis. Treatment may include:  Eating a high-fiber diet.  Taking a fiber supplement.  Taking a live bacteria supplement (probiotic).  Taking medicine to relax your colon.   Follow these instructions at home: Medicines  Take over-the-counter and prescription medicines only as told by your health care provider.  If told by your health care provider, take a fiber supplement or probiotic. Constipation prevention Your condition may cause constipation. To prevent or treat constipation, you may need to:  Drink enough fluid to keep your urine pale yellow.  Take over-the-counter or prescription medicines.  Eat foods that are high in fiber, such as beans, whole grains, and fresh fruits and vegetables.  Limit foods that are high in fat and processed sugars, such as fried or sweet foods.   General instructions  Try not to strain when you have a bowel movement.  Keep all follow-up visits as told by your health care provider. This is important. Contact a health care provider if you:  Have pain in your abdomen.  Have bloating.  Have cramps.  Have not had a bowel movement in 3 days. Get help right away if:  Your pain gets worse.  Your bloating becomes very bad.  You have a fever or chills, and your symptoms suddenly get worse.  You vomit.  You have bowel movements that are bloody or black.  You have bleeding from your rectum. Summary  Diverticulosis is a condition that develops when  small pouches (diverticula) form in the wall of the large intestine (colon).  You may have a few pouches or many of them.  This condition is most often diagnosed during an exam for other colon problems.  Treatment may include increasing the fiber in your diet, taking supplements, or taking medicines. This information is not intended to replace advice given to you by your health care provider. Make sure you discuss any questions you have with your health care provider. Document Revised: 02/27/2019 Document Reviewed: 02/27/2019 Elsevier Patient Education  [place].

## 2020-08-27 LAB — SURGICAL PATHOLOGY

## 2020-09-02 ENCOUNTER — Encounter (HOSPITAL_COMMUNITY): Payer: Self-pay | Admitting: Internal Medicine

## 2020-09-08 ENCOUNTER — Encounter (INDEPENDENT_AMBULATORY_CARE_PROVIDER_SITE_OTHER): Payer: Self-pay | Admitting: *Deleted

## 2020-09-23 ENCOUNTER — Other Ambulatory Visit: Payer: Self-pay

## 2020-09-23 ENCOUNTER — Ambulatory Visit
Admission: EM | Admit: 2020-09-23 | Discharge: 2020-09-23 | Disposition: A | Discharge status: Home / Self Care | Payer: BC Managed Care – PPO | Attending: Family Medicine | Admitting: Family Medicine

## 2020-09-23 DIAGNOSIS — H6122 Impacted cerumen, left ear: Secondary | ICD-10-CM

## 2020-09-23 DIAGNOSIS — H938X2 Other specified disorders of left ear: Secondary | ICD-10-CM | POA: Diagnosis not present

## 2020-09-23 NOTE — ED Provider Notes (Signed)
[place]   [id number] 09/23/20 Arrival Time: 9622  CC: EAR PAIN  SUBJECTIVE: History from: patient.  Lori Roth is a 60 y.o. female who presents with of left ear fullness. Denies a precipitating event, such as swimming or wearing ear plugs. Patient states the pain is constant and achy in character. Patient has not taken OTC medications for this. Symptoms are made worse with lying down. Reports similar symptoms in the past. Denies fever, chills, fatigue, sinus pain, rhinorrhea, ear discharge, sore throat, SOB, wheezing, chest pain, nausea, changes in bowel or bladder habits.    ROS: As per HPI.  All other pertinent ROS negative.     Past Medical History:  Diagnosis Date  . ADD (attention deficit disorder)   . Anemia   . Dyspnea   . Headache    Migraines  . Irregular bleeding 08/30/2015   Past Surgical History:  Procedure Laterality Date  . COLONOSCOPY N/A 08/26/2020   Procedure: COLONOSCOPY;  Surgeon: Rogene Houston, MD;  [facility];  Service: Endoscopy;  Laterality: N/A;  1:00  . DILATATION & CURETTAGE/HYSTEROSCOPY WITH MYOSURE N/A 11/07/2016   Procedure: DILATATION & CURETTAGE/HYSTEROSCOPY;  Surgeon: Jonnie Kind, MD;  Location: [facility];  Service: Gynecology;  Laterality: N/A;  X LARGE SCOPE  . POLYPECTOMY  08/26/2020   Procedure: POLYPECTOMY;  Surgeon: Rogene Houston, MD;  [facility];  Service: Endoscopy;;  . WISDOM TOOTH EXTRACTION     No Known Allergies No current facility-administered medications on file prior to encounter.   No current outpatient medications on file prior to encounter.   Social History   Socioeconomic History  . Marital status: Married    Spouse name: Not on file  . Number of children: Not on file  . Years of education: Not on file  . Highest education level: Not on file  Occupational History  . Not on file  Tobacco Use  . Smoking status: Never Smoker  . Smokeless tobacco: Never Used  Vaping Use   . Vaping Use: Never used  Substance and Sexual Activity  . Alcohol use: No  . Drug use: No  . Sexual activity: Yes    Birth control/protection: None, Other-see comments    Comment: vasectomy  Other Topics Concern  . Not on file  Social History Narrative  . Not on file   Social Determinants of Health   Financial Resource Strain: Low Risk   . Difficulty of Paying Living Expenses: Not hard at all  Food Insecurity: No Food Insecurity  . Worried About Charity fundraiser in the Last Year: Never true  . Ran Out of Food in the Last Year: Never true  Transportation Needs: No Transportation Needs  . Lack of Transportation (Medical): No  . Lack of Transportation (Non-Medical): No  Physical Activity: Insufficiently Active  . Days of Exercise per Week: 3 days  . Minutes of Exercise per Session: 30 min  Stress: No Stress Concern Present  . Feeling of Stress : Only a little  Social Connections: Socially Integrated  . Frequency of Communication with Friends and Family: More than three times a week  . Frequency of Social Gatherings with Friends and Family: More than three times a week  . Attends Religious Services: More than 4 times per year  . Active Member of Clubs or Organizations: Yes  . Attends Archivist Meetings: More than 4 times per year  . Marital Status: Married  Human resources officer Violence: Not At  Risk  . Fear of Current or Ex-Partner: No  . Emotionally Abused: No  . Physically Abused: No  . Sexually Abused: No   Family History  Problem Relation Age of Onset  . Diabetes Mother   . Hypertension Mother     OBJECTIVE:  Vitals:   09/23/20 1626  BP: 133/80  Pulse: 78  Resp: 18  Temp: 97.7 F (36.5 C)  SpO2: 98%     General appearance: alert; appears fatigued HEENT: Ears: L EAC with cerumen impaction, R EAC clear, TMs pearly gray with visible cone of light, without erythema; Eyes: PERRL, EOMI grossly; Sinuses nontender to palpation; Nose: clear rhinorrhea;  Throat: oropharynx mildly erythematous, tonsils 1+ without white tonsillar exudates, uvula midline Neck: supple without LAD Lungs: unlabored respirations, symmetrical air entry; cough: absent; no respiratory distress Heart: regular rate and rhythm.  Radial pulses 2+ symmetrical bilaterally Skin: warm and dry Psychological: alert and cooperative; normal mood and affect  Imaging: No results found.   ASSESSMENT & PLAN:  1. Impacted cerumen of left ear   2. Sensation of fullness in left ear    Ear irrigated in office today with good results Take medications as directed and to completion Continue to use OTC ibuprofen and/ or tylenol as needed for pain control Follow up with PCP if symptoms persists Return here or go to the ER if you have any new or worsening symptoms   Reviewed expectations re: course of current medical issues. Questions answered. Outlined signs and symptoms indicating need for more acute intervention. Patient verbalized understanding. After Visit Summary given.         Faustino Congress, NP 09/23/20 1710

## 2020-09-23 NOTE — Discharge Instructions (Signed)
We have cleaned out your ears in the office today.  To help prevent wax buildup, you may clean your ears with a solution of 1/2 water and 1/2 peroxide 2-3 times per week.  Follow up with this office or with primary care if symptoms are persisting.  Follow up in the ER for high fever, trouble swallowing, trouble breathing, other concerning symptoms.  

## 2021-03-16 ENCOUNTER — Other Ambulatory Visit (HOSPITAL_COMMUNITY): Payer: Self-pay | Admitting: Adult Health

## 2021-03-16 DIAGNOSIS — Z1231 Encounter for screening mammogram for malignant neoplasm of breast: Secondary | ICD-10-CM

## 2021-04-01 ENCOUNTER — Other Ambulatory Visit: Payer: BC Managed Care – PPO | Admitting: Adult Health

## 2021-04-03 ENCOUNTER — Encounter: Payer: Self-pay | Admitting: Emergency Medicine

## 2021-04-03 ENCOUNTER — Ambulatory Visit
Admission: EM | Admit: 2021-04-03 | Discharge: 2021-04-03 | Disposition: A | Discharge status: Home / Self Care | Payer: BC Managed Care – PPO

## 2021-04-03 ENCOUNTER — Other Ambulatory Visit: Payer: Self-pay

## 2021-04-03 DIAGNOSIS — H6122 Impacted cerumen, left ear: Secondary | ICD-10-CM | POA: Diagnosis not present

## 2021-04-03 NOTE — Discharge Instructions (Addendum)
Can continue with debrox and flushing at home Return if symptoms worsen

## 2021-04-03 NOTE — ED Provider Notes (Addendum)
[facility]    CSN: [id number] Arrival date & time: 04/03/21  1446      History   Chief Complaint No chief complaint on file.   HPI Lori Roth is a 60 y.o. female.   Pt complains of left ear fullness and muffled hearing that started last week.  She has used debrox and peroxide with minimal relief.  Denies right ear pain.    Past Medical History:  Diagnosis Date   ADD (attention deficit disorder)    Anemia    Dyspnea    Headache    Migraines   Irregular bleeding 08/30/2015    Patient Active Problem List   Diagnosis Date Noted   Encounter for gynecological examination with Papanicolaou smear of cervix 03/30/2020   Encounter for screening fecal occult blood testing 03/30/2020   Screening for colorectal cancer 03/30/2020   History of anemia 03/21/2017   Abnormal uterine bleeding (AUB) 11/15/2016   Irregular bleeding 08/30/2015    Past Surgical History:  Procedure Laterality Date   COLONOSCOPY N/A 08/26/2020   Procedure: COLONOSCOPY;  Surgeon: Rogene Houston, MD;  [facility];  Service: Endoscopy;  Laterality: N/A;  1:00   DILATATION & CURETTAGE/HYSTEROSCOPY WITH MYOSURE N/A 11/07/2016   Procedure: DILATATION & CURETTAGE/HYSTEROSCOPY;  Surgeon: Jonnie Kind, MD;  Location: [facility];  Service: Gynecology;  Laterality: N/A;  X LARGE SCOPE   POLYPECTOMY  08/26/2020   Procedure: POLYPECTOMY;  Surgeon: Rogene Houston, MD;  [facility];  Service: Endoscopy;;   WISDOM TOOTH EXTRACTION      OB History     Gravida  3   Para  3   Term  3   Preterm      AB      Living  3      SAB      IAB      Ectopic      Multiple      Live Births  3            Home Medications    Prior to Admission medications   Not on File    Family History Family History  Problem Relation Age of Onset   Diabetes Mother    Hypertension Mother     Social History Social History   Tobacco Use   Smoking status: Never    Smokeless tobacco: Never  Vaping Use   Vaping Use: Never used  Substance Use Topics   Alcohol use: No   Drug use: No     Allergies   Patient has no known allergies.   Review of Systems Review of Systems  Constitutional:  Negative for chills and fever.  HENT:  Negative for ear pain and sore throat.        Muffled hearing, ear fullness  Eyes:  Negative for pain and visual disturbance.  Respiratory:  Negative for cough and shortness of breath.   Cardiovascular:  Negative for chest pain and palpitations.  Gastrointestinal:  Negative for abdominal pain and vomiting.  Genitourinary:  Negative for dysuria and hematuria.  Musculoskeletal:  Negative for arthralgias and back pain.  Skin:  Negative for color change and rash.  Neurological:  Negative for seizures and syncope.  All other systems reviewed and are negative.   Physical Exam Triage Vital Signs ED Triage Vitals  Enc Vitals Group     BP 04/03/21 1544 133/75     Pulse Rate 04/03/21 1544 73     Resp 04/03/21  1544 16     Temp 04/03/21 1544 98.3 F (36.8 C)     Temp Source 04/03/21 1544 Tympanic     SpO2 04/03/21 1544 96 %     Weight --      Height --      Head Circumference --      Peak Flow --      Pain Score 04/03/21 1545 0     Pain Loc --      Pain Edu? --      Excl. in [place]? --    No data found.  Updated Vital Signs BP 133/75 (BP Location: Right Arm)   Pulse 73   Temp 98.3 F (36.8 C) (Tympanic)   Resp 16   LMP 12/09/2016   SpO2 96%   Visual Acuity Right Eye Distance:   Left Eye Distance:   Bilateral Distance:    Right Eye Near:   Left Eye Near:    Bilateral Near:     Physical Exam Vitals and nursing note reviewed.  Constitutional:      General: She is not in acute distress.    Appearance: She is well-developed.  HENT:     Head: Normocephalic and atraumatic.     Left Ear: There is impacted cerumen.  Eyes:     Conjunctiva/sclera: Conjunctivae normal.  Cardiovascular:     Rate and Rhythm:  Normal rate and regular rhythm.     Heart sounds: No murmur heard. Pulmonary:     Effort: Pulmonary effort is normal. No respiratory distress.     Breath sounds: Normal breath sounds.  Abdominal:     Palpations: Abdomen is soft.     Tenderness: There is no abdominal tenderness.  Musculoskeletal:     Cervical back: Neck supple.  Skin:    General: Skin is warm and dry.  Neurological:     Mental Status: She is alert.     UC Treatments / Results  Labs (all labs ordered are listed, but only abnormal results are displayed) Labs Reviewed - No data to display  EKG   Radiology No results found.  Procedures Procedures (including critical care time)  Medications Ordered in UC Medications - No data to display  Initial Impression / Assessment and Plan / UC Course  I have reviewed the triage vital signs and the nursing notes.  Pertinent labs & imaging results that were available during my care of the patient were reviewed by me and considered in my medical decision making (see chart for details).     Ear lavage done in clinic today.  Pt reports improvement, cerumen not fully removed.  Advised continued debrox and flushing at home, advised to not use q-tips.  Follow up with PCP if sx worsen.  Final Clinical Impressions(s) / UC Diagnoses   Final diagnoses:  None   Discharge Instructions   None    ED Prescriptions   None    PDMP not reviewed this encounter.   Ward, Lenise Arena, PA-C 04/03/21 1605    Ward, Lenise Arena, PA-C 04/03/21 1606

## 2021-04-03 NOTE — ED Triage Notes (Signed)
States left ear feels full.  States she has tried debrox and water and peroxide with out relief.

## 2021-04-20 ENCOUNTER — Ambulatory Visit (INDEPENDENT_AMBULATORY_CARE_PROVIDER_SITE_OTHER): Payer: BC Managed Care – PPO | Admitting: Adult Health

## 2021-04-20 ENCOUNTER — Ambulatory Visit (HOSPITAL_COMMUNITY)
Admission: RE | Admit: 2021-04-20 | Discharge: 2021-04-20 | Disposition: A | Discharge status: Home / Self Care | Payer: BC Managed Care – PPO | Source: Ambulatory Visit | Attending: Adult Health | Admitting: Adult Health

## 2021-04-20 ENCOUNTER — Encounter: Payer: Self-pay | Admitting: Adult Health

## 2021-04-20 ENCOUNTER — Other Ambulatory Visit: Payer: Self-pay

## 2021-04-20 ENCOUNTER — Ambulatory Visit (HOSPITAL_COMMUNITY): Payer: BC Managed Care – PPO

## 2021-04-20 VITALS — BP 135/77 | HR 93 | Ht 64.0 in | Wt 142.6 lb

## 2021-04-20 DIAGNOSIS — Z01419 Encounter for gynecological examination (general) (routine) without abnormal findings: Secondary | ICD-10-CM

## 2021-04-20 DIAGNOSIS — Z1231 Encounter for screening mammogram for malignant neoplasm of breast: Secondary | ICD-10-CM | POA: Insufficient documentation

## 2021-04-20 DIAGNOSIS — Z1211 Encounter for screening for malignant neoplasm of colon: Secondary | ICD-10-CM

## 2021-04-20 LAB — HEMOCCULT GUIAC POC 1CARD (OFFICE): Fecal Occult Blood, POC: NEGATIVE

## 2021-04-20 NOTE — Progress Notes (Signed)
Patient ID: Lori Roth, female   DOB: 01-21-1961, 60 y.o.   MRN: [id number] History of Present Illness: Lori Roth is a 60 year old white female,married, PM in for a well woman gyn exam. She is still teaching. She has a 56 month old grand daughter, Lori Roth, or JoJo. Lab Results  Component Value Date   DIAGPAP  03/30/2020    - Negative for intraepithelial lesion or malignancy (NILM)   [place] Negative 03/30/2020   PCP is Dr Willey Blade.  Current Medications, Allergies, Past Medical History, Past Surgical History, Family History and Social History were reviewed in [employer] record.     Review of Systems: Patient denies any headaches, hearing loss, fatigue, blurred vision, shortness of breath, chest pain, abdominal pain, problems with bowel movements, urination, or intercourse. No joint pain or mood swings. She denies any vaginal bleeding. Does not sleep well.   Physical Exam:BP 135/77 (BP Location: Right Arm, Patient Position: Sitting, Cuff Size: Normal)   Pulse 93   Ht '5\' 4"'$  (1.626 m)   Wt 142 lb 9.6 oz (64.7 kg)   LMP 12/09/2016   BMI 24.48 kg/m   General:  Well developed, well nourished, no acute distress Skin:  Warm and dry Neck:  Midline trachea, normal thyroid, good ROM, no lymphadenopathy Lungs; Clear to auscultation bilaterally Breast:  No dominant palpable mass, retraction, or nipple discharge Cardiovascular: Regular rate and rhythm Abdomen:  Soft, non tender, no hepatosplenomegaly Pelvic:  External genitalia is normal in appearance, no lesions.  The vagina is normal in appearance. Urethra has no lesions or masses. The cervix is bulbous.  Uterus is felt to be normal size, shape, and contour.  No adnexal masses or tenderness noted.Bladder is non tender, no masses felt. Rectal: Good sphincter tone, no polyps, or hemorrhoids felt.  Hemoccult negative. Extremities/musculoskeletal:  No swelling or varicosities noted, no clubbing or cyanosis Psych:  No mood  changes, alert and cooperative,seems happy AA is 0  Fall risk is low Depression screen [facility] 2/9 04/20/2021 03/30/2020 03/21/2017  Decreased Interest 0 0 0  Down, Depressed, Hopeless 0 0 0  PHQ - 2 Score 0 0 0  Altered sleeping 0 1 -  Tired, decreased energy 0 0 -  Change in appetite 0 0 -  Feeling bad or failure about yourself  0 0 -  Trouble concentrating 0 1 -  Moving slowly or fidgety/restless 0 0 -  Suicidal thoughts 0 0 -  PHQ-9 Score 0 2 -  Difficult doing work/chores - Not difficult at all -    GAD 7 : Generalized Anxiety Score 04/20/2021 03/30/2020  Nervous, Anxious, on Edge 1 1  Control/stop worrying 0 0  Worry too much - different things 1 0  Trouble relaxing 1 0  Restless 1 0  Easily annoyed or irritable 1 0  Afraid - awful might happen 1 0  Total GAD 7 Score 6 1  Anxiety Difficulty - Not difficult at all      Upstream - 04/20/21 1524       Pregnancy Intention Screening   Does the patient want to become pregnant in the next year? No    Does the patient's partner want to become pregnant in the next year? No    Would the patient like to discuss contraceptive options today? No      Contraception Wrap Up   Current Method Vasectomy    End Method Vasectomy    Contraception Counseling Provided No  Examination chaperoned by Celene Squibb LPN  Impression and Plan: 1. Encounter for well woman exam with routine gynecological exam Physical in 1 year Pap 2024 Labs with PCP Mammogram today Colonoscopy per GI Check BP at home occasionally   2. Encounter for screening fecal occult blood testing

## 2021-07-17 ENCOUNTER — Other Ambulatory Visit: Payer: Self-pay

## 2021-07-17 ENCOUNTER — Encounter: Payer: Self-pay | Admitting: *Deleted

## 2021-07-17 ENCOUNTER — Ambulatory Visit
Admission: EM | Admit: 2021-07-17 | Discharge: 2021-07-17 | Disposition: A | Discharge status: Home / Self Care | Payer: BC Managed Care – PPO | Attending: Family Medicine | Admitting: Family Medicine

## 2021-07-17 DIAGNOSIS — Z20822 Contact with and (suspected) exposure to covid-19: Secondary | ICD-10-CM | POA: Diagnosis not present

## 2021-07-17 DIAGNOSIS — R051 Acute cough: Secondary | ICD-10-CM | POA: Diagnosis not present

## 2021-07-17 DIAGNOSIS — J069 Acute upper respiratory infection, unspecified: Secondary | ICD-10-CM

## 2021-07-17 DIAGNOSIS — R5383 Other fatigue: Secondary | ICD-10-CM | POA: Diagnosis not present

## 2021-07-17 MED ORDER — OSELTAMIVIR PHOSPHATE 6 MG/ML PO SUSR: 75.0000 mg | Freq: Two times a day (BID) | ORAL | 0 refills | Status: AC

## 2021-07-17 MED ORDER — PROMETHAZINE-DM 6.25-15 MG/5ML PO SYRP: 5.0000 mL | ORAL_SOLUTION | Freq: Four times a day (QID) | ORAL | 0 refills | Status: DC | PRN

## 2021-07-17 NOTE — ED Provider Notes (Signed)
[facility]    CSN: [id number] Arrival date & time: 07/17/21  0945      History   Chief Complaint Chief Complaint  Patient presents with   Cough    HPI Lori Roth is a 60 y.o. female.   Presenting today with 2 to 3-day history of progressively worsening cough, congestion, hoarseness, sore throat, headache, fatigue, body aches, chills, significant fatigue.  She denies chest pain, shortness of breath, abdominal pain, diarrhea but has had several episodes of vomiting with coughing fits.  So far taking over-the-counter cold and congestion medications with minimal relief.  Husband tested positive for COVID 1 week ago, home test for her have been negative.  Past Medical History:  Diagnosis Date   ADD (attention deficit disorder)    Anemia    Dyspnea    Headache    Migraines   Irregular bleeding 08/30/2015    Patient Active Problem List   Diagnosis Date Noted   Encounter for well woman exam with routine gynecological exam 04/20/2021   Encounter for gynecological examination with Papanicolaou smear of cervix 03/30/2020   Encounter for screening fecal occult blood testing 03/30/2020   Screening for colorectal cancer 03/30/2020   History of anemia 03/21/2017   Abnormal uterine bleeding (AUB) 11/15/2016   Irregular bleeding 08/30/2015    Past Surgical History:  Procedure Laterality Date   COLONOSCOPY N/A 08/26/2020   Procedure: COLONOSCOPY;  Surgeon: Rogene Houston, MD;  [facility];  Service: Endoscopy;  Laterality: N/A;  1:00   DILATATION & CURETTAGE/HYSTEROSCOPY WITH MYOSURE N/A 11/07/2016   Procedure: DILATATION & CURETTAGE/HYSTEROSCOPY;  Surgeon: Jonnie Kind, MD;  Location: [facility];  Service: Gynecology;  Laterality: N/A;  X LARGE SCOPE   POLYPECTOMY  08/26/2020   Procedure: POLYPECTOMY;  Surgeon: Rogene Houston, MD;  [facility];  Service: Endoscopy;;   WISDOM TOOTH EXTRACTION      OB History     Gravida  3   Para  3    Term  3   Preterm      AB      Living  3      SAB      IAB      Ectopic      Multiple      Live Births  3            Home Medications    Prior to Admission medications   Medication Sig Start Date End Date Taking? Authorizing Provider  oseltamivir (TAMIFLU) 6 MG/ML SUSR suspension Take 12.5 mLs (75 mg total) by mouth 2 (two) times daily for 5 days. 07/17/21 07/22/21 Yes Volney American, PA-C  promethazine-dextromethorphan (PROMETHAZINE-DM) 6.25-15 MG/5ML syrup Take 5 mLs by mouth 4 (four) times daily as needed. 07/17/21  Yes Volney American, PA-C  triamcinolone ointment (KENALOG) 0.1 % Apply topically 2 (two) times daily. 04/07/21   [provider]    Family History Family History  Problem Relation Age of Onset   Diabetes Mother    Hypertension Mother    Dementia Mother     Social History Social History   Tobacco Use   Smoking status: Never   Smokeless tobacco: Never  Vaping Use   Vaping Use: Never used  Substance Use Topics   Alcohol use: No   Drug use: No     Allergies   Patient has no known allergies.   Review of Systems Review of Systems PER HPI   Physical Exam  Triage Vital Signs ED Triage Vitals  Enc Vitals Group     BP 07/17/21 1055 131/81     Pulse Rate 07/17/21 1055 84     Resp 07/17/21 1055 18     Temp 07/17/21 1055 98.1 F (36.7 C)     Temp Source 07/17/21 1055 Oral     SpO2 07/17/21 1055 97 %     Weight --      Height --      Head Circumference --      Peak Flow --      Pain Score 07/17/21 1056 3     Pain Loc --      Pain Edu? --      Excl. in [place]? --    No data found.  Updated Vital Signs BP 131/81   Pulse 84   Temp 98.1 F (36.7 C) (Oral)   Resp 18   LMP 12/09/2016   SpO2 97%   Visual Acuity Right Eye Distance:   Left Eye Distance:   Bilateral Distance:    Right Eye Near:   Left Eye Near:    Bilateral Near:     Physical Exam Vitals and nursing note reviewed.  Constitutional:       General: She is not in acute distress. HENT:     Head: Atraumatic.     Right Ear: Tympanic membrane and external ear normal.     Left Ear: Tympanic membrane and external ear normal.     Nose: Rhinorrhea present.     Mouth/Throat:     Mouth: Mucous membranes are moist.     Pharynx: Posterior oropharyngeal erythema present.  Eyes:     Extraocular Movements: Extraocular movements intact.     Conjunctiva/sclera: Conjunctivae normal.  Cardiovascular:     Rate and Rhythm: Normal rate and regular rhythm.     Heart sounds: Normal heart sounds.  Pulmonary:     Effort: Pulmonary effort is normal.     Breath sounds: Normal breath sounds. No wheezing or rales.  Abdominal:     General: Bowel sounds are normal. There is no distension.     Palpations: Abdomen is soft.     Tenderness: There is no abdominal tenderness. There is no guarding.  Musculoskeletal:        General: Normal range of motion.     Cervical back: Normal range of motion and neck supple.  Skin:    General: Skin is warm and dry.  Neurological:     Mental Status: She is alert and oriented to person, place, and time.  Psychiatric:        Mood and Affect: Mood normal.        Thought Content: Thought content normal.     UC Treatments / Results  Labs (all labs ordered are listed, but only abnormal results are displayed) Labs Reviewed  COVID-19, FLU A+B NAA    EKG   Radiology No results found.  Procedures Procedures (including critical care time)  Medications Ordered in UC Medications - No data to display  Initial Impression / Assessment and Plan / UC Course  I have reviewed the triage vital signs and the nursing notes.  Pertinent labs & imaging results that were available during my care of the patient were reviewed by me and considered in my medical decision making (see chart for details).     Vital signs benign and reassuring today, suspect viral upper respiratory infection.  She appears in no acute distress  today.  COVID,  flu testing pending.  We will start Tamiflu in case influenza particularly given home COVID test being negative.  Phenergan DM for symptomatic benefit.  Discussed over-the-counter supportive home care and medications.  Return for acutely worsening symptoms.  Work note given.  Final Clinical Impressions(s) / UC Diagnoses   Final diagnoses:  Acute cough  Encounter for laboratory testing for COVID-19 virus  Viral URI with cough  Fatigue, unspecified type   Discharge Instructions   None    ED Prescriptions     Medication Sig Dispense Auth. Provider   oseltamivir (TAMIFLU) 6 MG/ML SUSR suspension Take 12.5 mLs (75 mg total) by mouth 2 (two) times daily for 5 days. 125 mL Volney American, [place]   promethazine-dextromethorphan (PROMETHAZINE-DM) 6.25-15 MG/5ML syrup Take 5 mLs by mouth 4 (four) times daily as needed. 100 mL Volney American, [place]      PDMP not reviewed this encounter.   Volney American, [place] 07/17/21 1243

## 2021-07-17 NOTE — ED Triage Notes (Signed)
C/O cough, congestion, hoarse voice, HA, fatigue onset 4 days ago.  Yesterday was vomiting, but able to keep down PO fluids.  No known fevers. States spouse had Covid 1 wk ago, but pt states she is testing negative.

## 2021-07-18 LAB — COVID-19, FLU A+B NAA
Influenza A, NAA: NOT DETECTED
Influenza B, NAA: NOT DETECTED
SARS-CoV-2, NAA: NOT DETECTED

## 2022-03-29 ENCOUNTER — Other Ambulatory Visit (HOSPITAL_COMMUNITY): Payer: Self-pay | Admitting: Adult Health

## 2022-03-29 DIAGNOSIS — Z1231 Encounter for screening mammogram for malignant neoplasm of breast: Secondary | ICD-10-CM

## 2022-04-24 ENCOUNTER — Ambulatory Visit (HOSPITAL_COMMUNITY)
Admission: RE | Admit: 2022-04-24 | Discharge: 2022-04-24 | Disposition: A | Discharge status: Home / Self Care | Payer: BC Managed Care – PPO | Source: Ambulatory Visit | Attending: Adult Health | Admitting: Adult Health

## 2022-04-24 DIAGNOSIS — Z1231 Encounter for screening mammogram for malignant neoplasm of breast: Secondary | ICD-10-CM | POA: Insufficient documentation

## 2022-05-11 ENCOUNTER — Encounter: Payer: Self-pay | Admitting: Adult Health

## 2022-05-11 ENCOUNTER — Ambulatory Visit (INDEPENDENT_AMBULATORY_CARE_PROVIDER_SITE_OTHER): Payer: BC Managed Care – PPO | Admitting: Adult Health

## 2022-05-11 VITALS — BP 132/73 | HR 75 | Ht 63.25 in | Wt 139.5 lb

## 2022-05-11 DIAGNOSIS — Z1211 Encounter for screening for malignant neoplasm of colon: Secondary | ICD-10-CM

## 2022-05-11 DIAGNOSIS — Z01419 Encounter for gynecological examination (general) (routine) without abnormal findings: Secondary | ICD-10-CM

## 2022-05-11 LAB — HEMOCCULT GUIAC POC 1CARD (OFFICE): Fecal Occult Blood, POC: NEGATIVE

## 2022-05-11 NOTE — Progress Notes (Signed)
Patient ID: Lori Roth, female   DOB: July 31, 1961, 61 y.o.   MRN: [id number] History of Present Illness: Lori Roth is a 61 year old white female, married, PM in for well woman gyn exam. She is still teaching and dealing with aging parents.  Last pap was 03/30/20, negative HPV and malignancy.  PCP is Dr Lori Roth   Current Medications, Allergies, Past Medical History, Past Surgical History, Family History and Social History were reviewed in [facility] record.     Review of Systems: Patient denies any headaches, hearing loss, fatigue, blurred vision, shortness of breath, chest pain, abdominal pain, problems with bowel movements, urination, or intercourse. No joint pain or mood swings.  Denies any vaginal bleeding    Physical Exam:BP 132/73 (BP Location: Left Arm, Patient Position: Sitting, Cuff Size: Normal)   Pulse 75   Ht 5' 3.25" (1.607 m)   Wt 139 lb 8 oz (63.3 kg)   LMP 12/09/2016   BMI 24.52 kg/m   General:  Well developed, well nourished, no acute distress Skin:  Warm and dry Neck:  Midline trachea, normal thyroid, good ROM, no lymphadenopathy,no carotid bruits heard Lungs; Clear to auscultation bilaterally Breast:  No dominant palpable mass, retraction, or nipple discharge Cardiovascular: Regular rate and rhythm Abdomen:  Soft, non tender, no hepatosplenomegaly Pelvic:  External genitalia is normal in appearance, no lesions.  The vagina is pale. Urethra has no lesions or masses. The cervix is smooth.  Uterus is felt to be normal size, shape, and contour.  No adnexal masses or tenderness noted.Bladder is non tender, no masses felt. Rectal: Good sphincter tone, no polyps, or hemorrhoids felt.  Hemoccult negative. Extremities/musculoskeletal:  No swelling or varicosities noted, no clubbing or cyanosis Psych:  No mood changes, alert and cooperative,seems happy AA is 0 Fall risk is low    05/11/2022    2:33 PM 04/20/2021    3:28 PM 03/30/2020    3:18 PM   Depression screen PHQ 2/9  Decreased Interest 0 0 0  Down, Depressed, Hopeless 0 0 0  PHQ - 2 Score 0 0 0  Altered sleeping 2 0 1  Tired, decreased energy 1 0 0  Change in appetite 1 0 0  Feeling bad or failure about yourself  0 0 0  Trouble concentrating 1 0 1  Moving slowly or fidgety/restless 0 0 0  Suicidal thoughts 0 0 0  PHQ-9 Score 5 0 2  Difficult doing work/chores   Not difficult at all       05/11/2022    2:33 PM 04/20/2021    3:28 PM 03/30/2020    3:21 PM  GAD 7 : Generalized Anxiety Score  Nervous, Anxious, on Edge '1 1 1  '$ Control/stop worrying 1 0 0  Worry too much - different things 2 1 0  Trouble relaxing 2 1 0  Restless 1 1 0  Easily annoyed or irritable 2 1 0  Afraid - awful might happen 0 1 0  Total GAD 7 Score '9 6 1  '$ Anxiety Difficulty   Not difficult at all    Upstream - 05/11/22 1431       Pregnancy Intention Screening   Does the patient want to become pregnant in the next year? No    Does the patient's partner want to become pregnant in the next year? No    Would the patient like to discuss contraceptive options today? No      Contraception Wrap Up   Current Method Vasectomy  End Method Vasectomy    Contraception Counseling Provided No              Examination chaperoned by Levy Pupa LPN  Impression and Plan:   1. Encounter for well woman exam with routine gynecological exam Pap and physical in 1 year Will check labs fasting orders given to pt Had negative mammogram 04/24/22 Had colonoscopy 08/26/20 repeat in 7 years had tubular adenoma  - CBC - Comprehensive metabolic panel - TSH - Lipid panel  2. Encounter for screening fecal occult blood testing Hemoccult negative  - POCT occult blood stool

## 2023-03-22 ENCOUNTER — Other Ambulatory Visit (HOSPITAL_COMMUNITY): Payer: Self-pay | Admitting: Adult Health

## 2023-03-22 DIAGNOSIS — Z1231 Encounter for screening mammogram for malignant neoplasm of breast: Secondary | ICD-10-CM

## 2023-04-30 ENCOUNTER — Ambulatory Visit (HOSPITAL_COMMUNITY)
Admission: RE | Admit: 2023-04-30 | Discharge: 2023-04-30 | Disposition: A | Discharge status: Home / Self Care | Payer: BC Managed Care – PPO | Source: Ambulatory Visit | Attending: Adult Health | Admitting: Adult Health

## 2023-04-30 ENCOUNTER — Encounter (HOSPITAL_COMMUNITY): Payer: Self-pay

## 2023-04-30 DIAGNOSIS — Z1231 Encounter for screening mammogram for malignant neoplasm of breast: Secondary | ICD-10-CM | POA: Diagnosis present

## 2023-05-21 ENCOUNTER — Ambulatory Visit: Payer: BC Managed Care – PPO | Admitting: Adult Health

## 2023-05-22 ENCOUNTER — Encounter: Payer: Self-pay | Admitting: Adult Health

## 2023-05-22 ENCOUNTER — Ambulatory Visit (INDEPENDENT_AMBULATORY_CARE_PROVIDER_SITE_OTHER): Payer: BC Managed Care – PPO | Admitting: Adult Health

## 2023-05-22 ENCOUNTER — Other Ambulatory Visit (HOSPITAL_COMMUNITY)
Admission: RE | Admit: 2023-05-22 | Discharge: 2023-05-22 | Disposition: A | Discharge status: Home / Self Care | Payer: BC Managed Care – PPO | Source: Ambulatory Visit | Attending: Adult Health | Admitting: Adult Health

## 2023-05-22 VITALS — BP 137/74 | HR 76 | Ht 64.0 in | Wt 145.5 lb

## 2023-05-22 DIAGNOSIS — Z1211 Encounter for screening for malignant neoplasm of colon: Secondary | ICD-10-CM

## 2023-05-22 DIAGNOSIS — R635 Abnormal weight gain: Secondary | ICD-10-CM | POA: Diagnosis not present

## 2023-05-22 DIAGNOSIS — Z13220 Encounter for screening for lipoid disorders: Secondary | ICD-10-CM

## 2023-05-22 DIAGNOSIS — Z1330 Encounter for screening examination for mental health and behavioral disorders, unspecified: Secondary | ICD-10-CM | POA: Diagnosis not present

## 2023-05-22 DIAGNOSIS — Z01419 Encounter for gynecological examination (general) (routine) without abnormal findings: Secondary | ICD-10-CM | POA: Diagnosis not present

## 2023-05-22 DIAGNOSIS — G479 Sleep disorder, unspecified: Secondary | ICD-10-CM | POA: Diagnosis not present

## 2023-05-22 LAB — HEMOCCULT GUIAC POC 1CARD (OFFICE): Fecal Occult Blood, POC: NEGATIVE

## 2023-05-22 MED ORDER — TRAZODONE HCL 50 MG PO TABS: 50.0000 mg | ORAL_TABLET | Freq: Every evening | ORAL | 1 refills | Status: DC | PRN

## 2023-05-22 NOTE — Progress Notes (Signed)
Patient ID: Lori Roth, female   DOB: 1961-02-14, 62 y.o.   MRN: [id number] History of Present Illness: Lori Roth is a 62 year old white female,married, PM in for a well woman gyn exam and pap. She has gained some weight and is not sleeping well. 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  PCP is Dr Ouida Sills    Current Medications, Allergies, Past Medical History, Past Surgical History, Family History and Social History were reviewed in [facility] electronic medical record.     Review of Systems: Patient denies any headaches, hearing loss, fatigue, blurred vision, shortness of breath, chest pain, abdominal pain, problems with bowel movements, urination, or intercourse. No joint pain or mood swings.  See HPI for positives   Physical Exam:BP 137/74 (BP Location: Left Arm, Patient Position: Sitting, Cuff Size: Normal)   Pulse 76   Ht 5\' 4"  (1.626 m)   Wt 145 lb 8 oz (66 kg)   LMP 12/09/2016   BMI 24.98 kg/m   General:  Well developed, well nourished, no acute distress Skin:  Warm and dry Neck:  Midline trachea, normal thyroid, good ROM, no lymphadenopathy,no carotid bruits Lungs; Clear to auscultation bilaterally Breast:  No dominant palpable mass, retraction, or nipple discharge Cardiovascular: Regular rate and rhythm Abdomen:  Soft, non tender, no hepatosplenomegaly Pelvic:  External genitalia is normal in appearance, no lesions.  The vagina is normal in appearance. Urethra has no lesions or masses. The cervix is smooth, pap with HR HPV genotyping performed.  Uterus is felt to be normal size, shape, and contour.  No adnexal masses or tenderness noted.Bladder is non tender, no masses felt. Rectal: Good sphincter tone, no polyps, or hemorrhoids felt.  Hemoccult negative. Extremities/musculoskeletal:  No swelling or varicosities noted, no clubbing or cyanosis Psych:  No mood changes, alert and cooperative,seems happy AA is 0 Fall risk is low    05/22/2023     2:31 PM 05/11/2022    2:33 PM 04/20/2021    3:28 PM  Depression screen PHQ 2/9  Decreased Interest 0 0 0  Down, Depressed, Hopeless 0 0 0  PHQ - 2 Score 0 0 0  Altered sleeping 2 2 0  Tired, decreased energy 0 1 0  Change in appetite 2 1 0  Feeling bad or failure about yourself  0 0 0  Trouble concentrating 1 1 0  Moving slowly or fidgety/restless 0 0 0  Suicidal thoughts 0 0 0  PHQ-9 Score 5 5 0       05/22/2023    2:31 PM 05/11/2022    2:33 PM 04/20/2021    3:28 PM 03/30/2020    3:21 PM  GAD 7 : Generalized Anxiety Score  Nervous, Anxious, on Edge 1 1 1 1   Control/stop worrying 2 1 0 0  Worry too much - different things 2 2 1  0  Trouble relaxing 1 2 1  0  Restless 1 1 1  0  Easily annoyed or irritable 1 2 1  0  Afraid - awful might happen 1 0 1 0  Total GAD 7 Score 9 9 6 1   Anxiety Difficulty    Not difficult at all    Upstream - 05/22/23 1436       Pregnancy Intention Screening   Does the patient want to become pregnant in the next year? No    Does the patient's partner want to become pregnant in the next year? No    Would the patient like  to discuss contraceptive options today? No      Contraception Wrap Up   Current Method Vasectomy   pm   End Method Vasectomy   pm   Contraception Counseling Provided No            Examination chaperoned by Malachy Mood LPN     Impression and plan: 1. Encounter for gynecological examination with Papanicolaou smear of cervix Pap sent Pap in 3 years if normal Physical in 1 year Mammogram was negative 04/30/23 Colonoscopy due 2029 Will check labs, forgot to get last year  - Cytology - PAP( [facility]) - CBC - Comprehensive metabolic panel - Lipid panel  2. Encounter for screening fecal occult blood testing Hemoccult was negative  - POCT occult blood stool  3. Screening cholesterol level - Lipid panel  4. Weight gain - TSH + free T4  5. Sleep disturbance Has used Z-quil almost nightly Stop that and try trazodone  50 mg at hs, can try 1/2 tablet at first  Meds ordered this encounter  Medications   traZODone (DESYREL) 50 MG tablet    Sig: Take 1 tablet (50 mg total) by mouth at bedtime as needed for sleep.    Dispense:  30 tablet    Refill:  1    Order Specific Question:   Supervising Provider    Answer:   Duane Lope H [[id number]]

## 2023-05-25 LAB — CYTOLOGY - PAP
Comment: NEGATIVE
Diagnosis: NEGATIVE
High risk HPV: NEGATIVE

## 2023-06-08 LAB — COMPREHENSIVE METABOLIC PANEL
ALT: 19 [IU]/L (ref 0–32)
AST: 18 [IU]/L (ref 0–40)
Albumin: 4.4 g/dL (ref 3.8–4.9)
Alkaline Phosphatase: 98 [IU]/L (ref 44–121)
BUN/Creatinine Ratio: 26 (ref 12–28)
BUN: 20 mg/dL (ref 8–27)
Bilirubin Total: 0.6 mg/dL (ref 0.0–1.2)
CO2: 21 mmol/L (ref 20–29)
Calcium: 9.8 mg/dL (ref 8.7–10.3)
Chloride: 106 mmol/L (ref 96–106)
Creatinine, Ser: 0.76 mg/dL (ref 0.57–1.00)
Globulin, Total: 2.8 g/dL (ref 1.5–4.5)
Glucose: 84 mg/dL (ref 70–99)
Potassium: 4.9 mmol/L (ref 3.5–5.2)
Sodium: 145 mmol/L — ABNORMAL HIGH (ref 134–144)
Total Protein: 7.2 g/dL (ref 6.0–8.5)
eGFR: 90 mL/min/{1.73_m2} (ref >59)

## 2023-06-08 LAB — LIPID PANEL
Chol/HDL Ratio: 3.5 {ratio} (ref 0.0–4.4)
Cholesterol, Total: 204 mg/dL — ABNORMAL HIGH (ref 100–199)
HDL: 59 mg/dL (ref >39)
LDL Chol Calc (NIH): 130 mg/dL — ABNORMAL HIGH (ref 0–99)
Triglycerides: 86 mg/dL (ref 0–149)
VLDL Cholesterol Cal: 15 mg/dL (ref 5–40)

## 2023-06-08 LAB — CBC
Hematocrit: 42.6 % (ref 34.0–46.6)
Hemoglobin: 13.8 g/dL (ref 11.1–15.9)
MCH: 27.2 pg (ref 26.6–33.0)
MCHC: 32.4 g/dL (ref 31.5–35.7)
MCV: 84 fL (ref 79–97)
Platelets: 291 10*3/uL (ref 150–450)
RBC: 5.08 x10E6/uL (ref 3.77–5.28)
RDW: 13 % (ref 11.7–15.4)
WBC: 5.5 10*3/uL (ref 3.4–10.8)

## 2023-06-08 LAB — TSH+FREE T4
Free T4: 1.2 ng/dL (ref 0.82–1.77)
TSH: 0.948 u[IU]/mL (ref 0.450–4.500)

## 2023-10-03 ENCOUNTER — Telehealth: Payer: Self-pay | Admitting: Adult Health

## 2023-10-03 NOTE — Telephone Encounter (Signed)
 Patient couldn't figure out how to send message via myhcart, so she came by office. She said the lower dose of trazodone is helping her sleep better, but she wanted to see about increasing it. Please advise.

## 2023-10-03 NOTE — Telephone Encounter (Addendum)
 Spoke with pt. Pt is sleeping better but is not sleeping through the night. She wonders if Trazodone can be increased. Please advise. Thanks! JSY

## 2023-10-04 ENCOUNTER — Other Ambulatory Visit: Payer: Self-pay | Admitting: Adult Health

## 2023-10-04 MED ORDER — TRAZODONE HCL 100 MG PO TABS: 100.0000 mg | ORAL_TABLET | Freq: Every day | ORAL | 2 refills | Status: DC

## 2023-10-04 NOTE — Telephone Encounter (Signed)
 Left message sent trazodone 100 mg in can can 1 1/2 tablets for few days first

## 2023-10-04 NOTE — Progress Notes (Signed)
Increase Trazodone to 100mg at bedtime.

## 2024-04-17 ENCOUNTER — Other Ambulatory Visit (HOSPITAL_COMMUNITY): Payer: Self-pay | Admitting: Adult Health

## 2024-04-17 DIAGNOSIS — Z1231 Encounter for screening mammogram for malignant neoplasm of breast: Secondary | ICD-10-CM

## 2024-05-01 ENCOUNTER — Ambulatory Visit (HOSPITAL_COMMUNITY)
Admission: RE | Admit: 2024-05-01 | Discharge: 2024-05-01 | Disposition: A | Discharge status: Home / Self Care | Payer: Self-pay | Source: Ambulatory Visit | Attending: Adult Health | Admitting: Adult Health

## 2024-05-01 ENCOUNTER — Encounter (HOSPITAL_COMMUNITY): Payer: Self-pay

## 2024-05-01 DIAGNOSIS — Z1231 Encounter for screening mammogram for malignant neoplasm of breast: Secondary | ICD-10-CM | POA: Diagnosis present

## 2024-05-06 ENCOUNTER — Ambulatory Visit: Payer: Self-pay | Admitting: Adult Health

## 2024-09-10 ENCOUNTER — Encounter: Payer: Self-pay | Admitting: Adult Health

## 2024-09-10 ENCOUNTER — Ambulatory Visit: Admitting: Adult Health

## 2024-09-10 VITALS — BP 132/66 | HR 85 | Ht 64.0 in | Wt 149.5 lb

## 2024-09-10 DIAGNOSIS — G479 Sleep disorder, unspecified: Secondary | ICD-10-CM

## 2024-09-10 DIAGNOSIS — E7800 Pure hypercholesterolemia, unspecified: Secondary | ICD-10-CM | POA: Diagnosis not present

## 2024-09-10 DIAGNOSIS — Z01419 Encounter for gynecological examination (general) (routine) without abnormal findings: Secondary | ICD-10-CM | POA: Diagnosis not present

## 2024-09-10 DIAGNOSIS — F418 Other specified anxiety disorders: Secondary | ICD-10-CM

## 2024-09-10 DIAGNOSIS — F419 Anxiety disorder, unspecified: Secondary | ICD-10-CM | POA: Insufficient documentation

## 2024-09-10 MED ORDER — ESCITALOPRAM OXALATE 10 MG PO TABS: 10.0000 mg | ORAL_TABLET | Freq: Every day | ORAL | 2 refills | Status: AC

## 2024-09-10 MED ORDER — HYDROXYZINE HCL 10 MG PO TABS: 10.0000 mg | ORAL_TABLET | Freq: Three times a day (TID) | ORAL | 3 refills | Status: AC | PRN

## 2024-09-10 NOTE — Progress Notes (Signed)
 Patient ID: Lori Roth, female   DOB: January 30, 1961, 64 y.o.   MRN: [id number] History of Present Illness: Lori Roth is a 64 year old white female, married, PM in for a well woman gyn exam. 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     Component Value Date/Time   DIAGPAP  05/22/2023 1437    - Negative for intraepithelial lesion or malignancy (NILM)   DIAGPAP  03/30/2020 1514    - Negative for intraepithelial lesion or malignancy (NILM)   HPVHIGH Negative 05/22/2023 1437   HPVHIGH Negative 03/30/2020 1514   ADEQPAP  05/22/2023 1437    Satisfactory for evaluation; transformation zone component PRESENT.   ADEQPAP  03/30/2020 1514    Satisfactory for evaluation; transformation zone component PRESENT.    PCP is Dr Sheryle   Current Medications, Allergies, Past Medical History, Past Surgical History, Family History and Social History were reviewed in [facility] electronic medical record.     Review of Systems: Patient denies any headaches, hearing loss, fatigue, blurred vision, shortness of breath, chest pain, abdominal pain, problems with bowel movements, urination, or intercourse. No joint pain or mood swings.  Denies any vaginal bleeding +stress   Physical Exam:BP 132/66 (BP Location: Left Arm, Patient Position: Sitting, Cuff Size: Normal)   Pulse 85   Ht 5' 4 (1.626 m)   Wt 149 lb 8 oz (67.8 kg)   LMP 12/09/2016   BMI 25.66 kg/m   General:  Well developed, well nourished, no acute distress Skin:  Warm and dry Neck:  Midline trachea, normal thyroid, good ROM, no lymphadenopathy, no carotid bruits heard  Lungs; Clear to auscultation bilaterally Breast:  No dominant palpable mass, retraction, or nipple discharge Cardiovascular: Regular rate and rhythm Abdomen:  Soft, non tender, no hepatosplenomegaly Pelvic:  External genitalia is normal in appearance, no lesions.  The vagina is pale. Urethra has no lesions or masses. The cervix  is smooth..  Uterus is felt to be normal size, shape, and contour.  No adnexal masses or tenderness noted.Bladder is non tender, no masses felt. Rectal: Deferred  Extremities/musculoskeletal:  No swelling or varicosities noted, no clubbing or cyanosis Psych:  No mood changes, alert and cooperative,seems happy AA is 0 Fall risk is low    09/10/2024    3:31 PM 05/22/2023    2:31 PM 05/11/2022    2:33 PM  Depression screen PHQ 2/9  Decreased Interest 0 0 0  Down, Depressed, Hopeless 0 0 0  PHQ - 2 Score 0 0 0  Altered sleeping 2 2 2   Tired, decreased energy 1 0 1  Change in appetite 0 2 1  Feeling bad or failure about yourself  0 0 0  Trouble concentrating 3 1 1   Moving slowly or fidgety/restless 0 0 0  Suicidal thoughts 0 0 0  PHQ-9 Score 6 5  5       Data saved with a previous flowsheet row definition       09/10/2024    3:31 PM 05/22/2023    2:31 PM 05/11/2022    2:33 PM 04/20/2021    3:28 PM  GAD 7 : Generalized Anxiety Score  Nervous, Anxious, on Edge 1 1  1  1    Control/stop worrying 1 2  1   0   Worry too much - different things 2 2  2  1    Trouble relaxing 1 1  2  1    Restless 1 1  1  1   Easily annoyed or irritable 1 1  2  1    Afraid - awful might happen 1 1  0  1   Total GAD 7 Score 8 9 9 6      Data saved with a previous flowsheet row definition      Upstream - 09/10/24 1536       Pregnancy Intention Screening   Does the patient want to become pregnant in the next year? N/A    Does the patient's partner want to become pregnant in the next year? N/A    Would the patient like to discuss contraceptive options today? N/A      Contraception Wrap Up   Current Method Post-Menopause;Vasectomy    End Method Post-Menopause;Vasectomy    Contraception Counseling Provided No         Examination chaperoned by Clarita Salt LPN  Impression and plan: 1. Encounter for well woman exam with routine gynecological exam (Primary) Pap and physical in 1 year Mammogram was negative  05/01/24 Colonoscopy per GI due 2029 Will check labs  - CBC - Comprehensive metabolic panel with GFR - Lipid panel  2. Anxiety and depression Will rx lexapro  10 mg 1 daily and vistaril  10 mg every 8 hours prn Meds ordered this encounter  Medications   escitalopram  (LEXAPRO ) 10 MG tablet    Sig: Take 1 tablet (10 mg total) by mouth daily.    Dispense:  30 tablet    Refill:  2    Supervising Provider:   JAYNE MINDER H [[id number]]   hydrOXYzine  (ATARAX ) 10 MG tablet    Sig: Take 1 tablet (10 mg total) by mouth every 8 (eight) hours as needed.    Dispense:  30 tablet    Refill:  3    Supervising Provider:   JAYNE MINDER H [[id number]]    Follow up in 8 weeks for ROS  3. Sleep disturbance Can try vistaril    4. Elevated cholesterol - Lipid panel

## 2024-11-06 ENCOUNTER — Ambulatory Visit: Admitting: Adult Health
